# Patient Record
Sex: Male | Born: 1975 | Race: White | Hispanic: No | Marital: Married | Smoking: Current every day smoker
Health system: Southern US, Community
[De-identification: ages and names within clinical notes are randomized; demographics above are authoritative.]

## PROBLEM LIST (undated history)

## (undated) DIAGNOSIS — I10 Essential (primary) hypertension: Secondary | ICD-10-CM

## (undated) DIAGNOSIS — G4730 Sleep apnea, unspecified: Secondary | ICD-10-CM

## (undated) DIAGNOSIS — I639 Cerebral infarction, unspecified: Secondary | ICD-10-CM

## (undated) DIAGNOSIS — I219 Acute myocardial infarction, unspecified: Secondary | ICD-10-CM

## (undated) DIAGNOSIS — E785 Hyperlipidemia, unspecified: Secondary | ICD-10-CM

## (undated) DIAGNOSIS — F4310 Post-traumatic stress disorder, unspecified: Secondary | ICD-10-CM

## (undated) HISTORY — DX: Acute myocardial infarction, unspecified: I21.9

## (undated) HISTORY — DX: Cerebral infarction, unspecified: I63.9

## (undated) HISTORY — DX: Hyperlipidemia, unspecified: E78.5

---

## 2005-01-24 ENCOUNTER — Emergency Department: Payer: Self-pay | Admitting: Emergency Medicine

## 2005-03-15 ENCOUNTER — Emergency Department: Payer: Self-pay | Admitting: Emergency Medicine

## 2006-04-03 ENCOUNTER — Other Ambulatory Visit: Payer: Self-pay

## 2006-04-03 ENCOUNTER — Emergency Department: Payer: Self-pay | Admitting: Emergency Medicine

## 2006-08-26 ENCOUNTER — Emergency Department: Payer: Self-pay | Admitting: Emergency Medicine

## 2008-01-08 ENCOUNTER — Emergency Department: Payer: Self-pay | Admitting: Emergency Medicine

## 2012-02-23 ENCOUNTER — Emergency Department (HOSPITAL_COMMUNITY)
Admission: EM | Admit: 2012-02-23 | Discharge: 2012-02-24 | Disposition: A | Discharge status: Home / Self Care | Payer: Self-pay | Attending: *Deleted | Admitting: *Deleted

## 2012-02-23 ENCOUNTER — Encounter (HOSPITAL_COMMUNITY): Payer: Self-pay | Admitting: Emergency Medicine

## 2012-02-23 DIAGNOSIS — F17200 Nicotine dependence, unspecified, uncomplicated: Secondary | ICD-10-CM | POA: Insufficient documentation

## 2012-02-23 DIAGNOSIS — E119 Type 2 diabetes mellitus without complications: Secondary | ICD-10-CM | POA: Insufficient documentation

## 2012-02-23 DIAGNOSIS — R45851 Suicidal ideations: Secondary | ICD-10-CM | POA: Insufficient documentation

## 2012-02-23 LAB — CBC
HCT: 48.2 % (ref 39.0–52.0)
Hemoglobin: 16.7 g/dL (ref 13.0–17.0)
MCH: 31.1 pg (ref 26.0–34.0)
MCHC: 34.6 g/dL (ref 30.0–36.0)

## 2012-02-23 LAB — DIFFERENTIAL
Basophils Relative: 0 % (ref 0–1)
Eosinophils Absolute: 0 10*3/uL (ref 0.0–0.7)
Eosinophils Relative: 0 % (ref 0–5)
Monocytes Absolute: 0.6 10*3/uL (ref 0.1–1.0)
Monocytes Relative: 6 % (ref 3–12)

## 2012-02-23 LAB — RAPID URINE DRUG SCREEN, HOSP PERFORMED
Barbiturates: NOT DETECTED
Benzodiazepines: NOT DETECTED
Cocaine: NOT DETECTED

## 2012-02-23 LAB — BASIC METABOLIC PANEL
BUN: 14 mg/dL (ref 6–23)
Creatinine, Ser: 1.16 mg/dL (ref 0.50–1.35)
GFR calc Af Amer: >90 mL/min (ref >90)
GFR calc non Af Amer: 81 mL/min — ABNORMAL LOW (ref >90)

## 2012-02-23 MED ORDER — ONDANSETRON HCL 4 MG PO TABS: 4.0000 mg | ORAL_TABLET | Freq: Three times a day (TID) | ORAL | Status: DC | PRN

## 2012-02-23 MED ORDER — NICOTINE 21 MG/24HR TD PT24: 21.0000 mg | MEDICATED_PATCH | Freq: Every day | TRANSDERMAL | Status: DC | PRN

## 2012-02-23 MED ORDER — ZOLPIDEM TARTRATE 5 MG PO TABS: 5.0000 mg | ORAL_TABLET | Freq: Every evening | ORAL | Status: DC | PRN

## 2012-02-23 MED ORDER — ACETAMINOPHEN 325 MG PO TABS: 650.0000 mg | ORAL_TABLET | ORAL | Status: DC | PRN

## 2012-02-23 MED ORDER — LORAZEPAM 1 MG PO TABS: 1.0000 mg | ORAL_TABLET | Freq: Three times a day (TID) | ORAL | Status: DC | PRN

## 2012-02-23 MED ORDER — IBUPROFEN 400 MG PO TABS: 400.0000 mg | ORAL_TABLET | Freq: Three times a day (TID) | ORAL | Status: DC | PRN

## 2012-02-23 MED ORDER — ALUM & MAG HYDROXIDE-SIMETH 200-200-20 MG/5ML PO SUSP: 30.0000 mL | ORAL | Status: DC | PRN

## 2012-02-23 NOTE — ED Notes (Signed)
Patient states "I had a plan tonight to kill myself. I had a knife in my hand and was going to cut my wrist." Patient states he was recently separated from his wife.
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
## 2012-02-24 ENCOUNTER — Encounter (HOSPITAL_COMMUNITY): Payer: Self-pay | Admitting: *Deleted

## 2012-02-24 ENCOUNTER — Inpatient Hospital Stay (HOSPITAL_COMMUNITY)
Admission: AD | Admit: 2012-02-24 | Discharge: 2012-02-28 | DRG: 103 | Disposition: A | Discharge status: Home / Self Care | Payer: 59 | Attending: Psychiatry | Admitting: Psychiatry

## 2012-02-24 DIAGNOSIS — F17200 Nicotine dependence, unspecified, uncomplicated: Secondary | ICD-10-CM | POA: Diagnosis present

## 2012-02-24 DIAGNOSIS — R45851 Suicidal ideations: Secondary | ICD-10-CM

## 2012-02-24 DIAGNOSIS — R454 Irritability and anger: Secondary | ICD-10-CM | POA: Diagnosis present

## 2012-02-24 DIAGNOSIS — E119 Type 2 diabetes mellitus without complications: Secondary | ICD-10-CM | POA: Diagnosis present

## 2012-02-24 DIAGNOSIS — Z87828 Personal history of other (healed) physical injury and trauma: Secondary | ICD-10-CM

## 2012-02-24 DIAGNOSIS — S069X9A Unspecified intracranial injury with loss of consciousness of unspecified duration, initial encounter: Secondary | ICD-10-CM

## 2012-02-24 DIAGNOSIS — F0781 Postconcussional syndrome: Principal | ICD-10-CM

## 2012-02-24 MED ORDER — ALUM & MAG HYDROXIDE-SIMETH 200-200-20 MG/5ML PO SUSP: 30.0000 mL | ORAL | Status: DC | PRN

## 2012-02-24 MED ORDER — ACETAMINOPHEN 325 MG PO TABS: 650.0000 mg | ORAL_TABLET | Freq: Four times a day (QID) | ORAL | Status: DC | PRN

## 2012-02-24 MED ORDER — MAGNESIUM HYDROXIDE 400 MG/5ML PO SUSP: 30.0000 mL | Freq: Every day | ORAL | Status: DC | PRN

## 2012-02-24 MED ORDER — NICOTINE 21 MG/24HR TD PT24
21.0000 mg | MEDICATED_PATCH | Freq: Every day | TRANSDERMAL | Status: DC
  Administered 2012-02-25 – 2012-02-28 (×5): 21 mg via TRANSDERMAL
  Filled 2012-02-24 (×7): qty 1

## 2012-02-24 MED ORDER — TRAZODONE HCL 50 MG PO TABS
50.0000 mg | ORAL_TABLET | Freq: Every evening | ORAL | Status: DC | PRN
  Administered 2012-02-26 – 2012-02-27 (×2): 50 mg via ORAL
  Filled 2012-02-24 (×2): qty 1
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
## 2012-02-24 NOTE — ED Notes (Signed)
Patient is awake at current time. Offered him something to drink and asked did he need to go to the restroom, stated that he is comfortable is going back to sleep.
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
## 2012-02-24 NOTE — Progress Notes (Signed)
Patient ID: Don Owens, male   DOB: 03/05/1976, 36 y.o.   MRN: [id number] This is the first admission for this 36 year old caucasian male, admitted voluntarily to [facility], due to feeling suicidal about the loss of his marriage, as of yesterday. He resports he has been legally divorced for 1 month, that this is his 2nd marriage and that she has left him...and " turned everybody against me" and I don';t have anyting...." he denies PMH, other than DM, for which he states he should take glucophage..he states he cannot afford it, that he last took it 2 weeks ago and that he takes it " when I know my blood sugar is up" " i don't have a blood sugar machine....". He denies allergies to foods and / or drugs, stated he has  GED, that he works at [employer] and that he " feels much safer" " now that I'm here...". After admission is completed, verbal reprots of this pt's status is given to NA, RN, along with instruction that careplan, as well as MD orders need to be completed on this pt's admission PD RN [facility]

## 2012-02-24 NOTE — Progress Notes (Signed)
Patient resting quietly in room upon my approach. Patient withdrawn and forwards little. Patient denies any needs or concerns at this time.Patient denies SI/HI, denies A/V hallucinations. Patient offered support and encouragement. Patient remains safe on unit with Q15 minute checks for safety. Will continue to monitor.

## 2012-02-24 NOTE — ED Notes (Signed)
carelink here for pt.  

## 2012-02-24 NOTE — Progress Notes (Signed)
Brief Nutrition Note  Reason: Patient screened positive nutrition risk for unintentional weight loss >10 lb over 1 month,  Patient reported he is eating well at [facility]. PTA he had not eaten anything since Sunday. He reported he always wanted to loose weight but it took getting to this point to loose the weight. He reported he was not eating PTA due to lack of appetite.   Wt Readings from Last 10 Encounters:  02/23/12 275 lb (124.739 kg)   I encouraged the patient to have adequate PO intake. I briefly educated the patient on healthy nutrition and food choices. The patient did not have any nutrition questions.   RD available for nutrition needs.   Iven Finn [facility] [phone]

## 2012-02-24 NOTE — Progress Notes (Signed)
[facility] MD Progress Note  02/24/2012 5:34 PM  Diagnosis:   Axis I: Suicidal thoughts with head injury with subsequent difficulty with anger Axis II: Deferred Axis III:  Past Medical History  Diagnosis Date  . Diabetes mellitus     ADL's:  Intact  Sleep: Good  Appetite:  Poor  Suicidal Ideation:  Pt had thoughts to be dead yesterday and had plans of writing a goodbye letter to his family because he was going to kill himself. Homicidal Ideation:  Denies adamantly any homicidal thoughts.  Mental Status Examination/Evaluation: Objective:  Appearance: Casual  Eye Contact::  Fair  Speech:  Clear and Coherent  Volume:  Normal  Mood:  Anxious, Depressed, Hopeless, Irritable and Worthless  Affect:  Congruent  Thought Process:  Coherent  Orientation:  Full  Thought Content:  WDL  Suicidal Thoughts:  Yes.  without intent/plan  Homicidal Thoughts:  No  Memory:  Immediate;   Fair  Judgement:  Impaired  Insight:  Lacking  Psychomotor Activity:  Normal  Concentration:  Fair  Recall:  Fair  Akathisia:  No  Handed:  Right  AIMS (if indicated):     Assets:  Communication Skills Desire for Improvement  Sleep:      Vital Signs:There were no vitals taken for this visit. Current Medications: Current Facility-Administered Medications  Medication Dose Route Frequency Provider Last Rate Last Dose  . acetaminophen (TYLENOL) tablet 650 mg  650 mg Oral Q6H PRN Mike Craze, MD      . alum & mag hydroxide-simeth (MAALOX/MYLANTA) 200-200-20 MG/5ML suspension 30 mL  30 mL Oral Q4H PRN Mike Craze, MD      . magnesium hydroxide (MILK OF MAGNESIA) suspension 30 mL  30 mL Oral Daily PRN Mike Craze, MD      . traZODone (DESYREL) tablet 50 mg  50 mg Oral QHS PRN Mike Craze, MD       Facility-Administered Medications Ordered in Other Encounters  Medication Dose Route Frequency Provider Last Rate Last Dose  . DISCONTD: acetaminophen (TYLENOL) tablet 650 mg  650 mg Oral Q4H PRN Laray Anger, DO      . DISCONTD: alum & mag hydroxide-simeth (MAALOX/MYLANTA) 200-200-20 MG/5ML suspension 30 mL  30 mL Oral PRN Laray Anger, DO      . DISCONTD: ibuprofen (ADVIL,MOTRIN) tablet 400 mg  400 mg Oral Q8H PRN Laray Anger, DO      . DISCONTD: LORazepam (ATIVAN) tablet 1 mg  1 mg Oral Q8H PRN Laray Anger, DO      . DISCONTD: nicotine (NICODERM CQ - dosed in mg/24 hours) patch 21 mg  21 mg Transdermal Daily PRN Laray Anger, DO      . DISCONTD: ondansetron [facility]) tablet 4 mg  4 mg Oral Q8H PRN Laray Anger, DO      . DISCONTD: zolpidem (AMBIEN) tablet 5 mg  5 mg Oral QHS PRN Laray Anger, DO        Lab Results:  Results for orders placed during the hospital encounter of 02/23/12 (from the past 48 hour(s))  ETHANOL     Status: Normal   Collection Time   02/23/12  9:33 PM      Component Value Range Comment   Alcohol, Ethyl (B) <11  0 - 11 mg/dL   CBC     Status: Abnormal   Collection Time   02/23/12  9:33 PM      Component Value Range Comment  WBC 11.4 (*) 4.0 - 10.5 K/uL    RBC 5.37  4.22 - 5.81 MIL/uL    Hemoglobin 16.7  13.0 - 17.0 g/dL    HCT 95.6  21.3 - 08.6 %    MCV 89.8  78.0 - 100.0 fL    MCH 31.1  26.0 - 34.0 pg    MCHC 34.6  30.0 - 36.0 g/dL    RDW 57.8  46.9 - 62.9 %    Platelets 219  150 - 400 K/uL   DIFFERENTIAL     Status: Normal   Collection Time   02/23/12  9:33 PM      Component Value Range Comment   Neutrophils Relative 65  43 - 77 %    Neutro Abs 7.5  1.7 - 7.7 K/uL    Lymphocytes Relative 29  12 - 46 %    Lymphs Abs 3.3  0.7 - 4.0 K/uL    Monocytes Relative 6  3 - 12 %    Monocytes Absolute 0.6  0.1 - 1.0 K/uL    Eosinophils Relative 0  0 - 5 %    Eosinophils Absolute 0.0  0.0 - 0.7 K/uL    Basophils Relative 0  0 - 1 %    Basophils Absolute 0.0  0.0 - 0.1 K/uL   BASIC METABOLIC PANEL     Status: Abnormal   Collection Time   02/23/12  9:33 PM      Component Value Range Comment   Sodium 142  135 - 145 mEq/L     Potassium 3.6  3.5 - 5.1 mEq/L    Chloride 103  96 - 112 mEq/L    CO2 26  19 - 32 mEq/L    Glucose, Bld 120 (*) 70 - 99 mg/dL    BUN 14  6 - 23 mg/dL    Creatinine, Ser 5.28  0.50 - 1.35 mg/dL    Calcium 41.3  8.4 - 10.5 mg/dL    GFR calc non Af Amer 81 (*) >90 mL/min    GFR calc Af Amer >90  >90 mL/min   URINE RAPID DRUG SCREEN (HOSP PERFORMED)     Status: Normal   Collection Time   02/23/12  9:35 PM      Component Value Range Comment   Opiates NONE DETECTED  NONE DETECTED    Cocaine NONE DETECTED  NONE DETECTED    Benzodiazepines NONE DETECTED  NONE DETECTED    Amphetamines NONE DETECTED  NONE DETECTED    Tetrahydrocannabinol NONE DETECTED  NONE DETECTED    Barbiturates NONE DETECTED  NONE DETECTED   GLUCOSE, CAPILLARY     Status: Abnormal   Collection Time   02/24/12  9:57 AM      Component Value Range Comment   Glucose-Capillary 107 (*) 70 - 99 mg/dL    Comment 1 Documented in Chart      Comment 2 Notify RN       Physical Findings: AIMS:  , ,  ,  ,    CIWA:  CIWA-Ar Total: 0  COWS:  COWS Total Score: 2   Treatment Plan Summary: Daily contact with patient to assess and evaluate symptoms and progress in treatment Medication management No suicidal thoughts for 48 hours and mood/anxiety less than 3/10 where 1 is the best and 10 is the worst  Plan: Admit, Start Tegretol for anger dyscontrol.  Don Owens 02/24/2012, 5:34 PM

## 2012-02-24 NOTE — Progress Notes (Signed)
0130 Don Owens, ACT has seen and evaluated patient. Paperwork has been sent to [facility]. Bed will not be available tonight.

## 2012-02-24 NOTE — BH Assessment (Signed)
Assessment Note   Don Owens is an 36 y.o. male. Patient continues to endorse suicidality, depressed mood, loss of interest in usual activities and inability to contract for safety. He continues to need inpatient stabilization.   Patient has been accepted by Jorje Guild PA to Dr. Dan Humphreys, room 504.1  Axis I: Depressive Disorder NOS Axis II: Deferred Axis III:  Past Medical History  Diagnosis Date  . Diabetes mellitus    Axis IV: problems with primary support group Axis V: 30  Past Medical History:  Past Medical History  Diagnosis Date  . Diabetes mellitus     History reviewed. No pertinent past surgical history.  Family History: History reviewed. No pertinent family history.  Social History:  reports that he has been smoking.  He does not have any smokeless tobacco history on file. He reports that he does not drink alcohol or use illicit drugs.  Additional Social History:     CIWA: CIWA-Ar BP: 109/62 mmHg Pulse Rate: 61  COWS:    Allergies: No Known Allergies  Home Medications:  (Not in a hospital admission)  OB/GYN Status:  No LMP for male patient.  General Assessment Data Location of Assessment: [facility] ACT Assessment: Yes Living Arrangements: Alone Can pt return to current living arrangement?: Yes Admission Status: Voluntary Is patient capable of signing voluntary admission?: Yes Transfer from: Home Referral Source: Self/Family/Friend  Education Status Is patient currently in school?: No Contact person: JUDY SEAGREVES-[phone]  Risk to self Suicidal Ideation: Yes-Currently Present Suicidal Intent: Yes-Currently Present Is patient at risk for suicide?: Yes Suicidal Plan?: Yes-Currently Present Specify Current Suicidal Plan:  (Cut wrist) Access to Means: Yes Specify Access to Suicidal Means:  (Sharps) What has been your use of drugs/alcohol within the last 12 months?:  (None reported) Previous Attempts/Gestures: Yes How many times?:   (1x) Other Self Harm Risks:  (None reported) Triggers for Past Attempts: Spouse contact Intentional Self Injurious Behavior: Cutting Comment - Self Injurious Behavior:  (Cut self last week) Family Suicide History: No Recent stressful life event(s): Loss (Comment) (Separation from wife x1 month) Persecutory voices/beliefs?: No Depression: Yes Depression Symptoms: Despondent;Isolating;Loss of interest in usual pleasures;Feeling worthless/self pity;Feeling angry/irritable Substance abuse history and/or treatment for substance abuse?: No Suicide prevention information given to non-admitted patients: Not applicable  Risk to Others Homicidal Ideation: No Thoughts of Harm to Others: No Current Homicidal Intent: No Current Homicidal Plan: No Access to Homicidal Means: No Identified Victim:  (Na) History of harm to others?: No Assessment of Violence: None Noted Violent Behavior Description:  (Na) Does patient have access to weapons?: No Criminal Charges Pending?: No Does patient have a court date: No  Psychosis Hallucinations: None noted Delusions: None noted  Mental Status Report Appear/Hygiene:  (WNL) Eye Contact: Fair Motor Activity: Unremarkable;Freedom of movement Speech: Logical/coherent Level of Consciousness: Alert Mood: Depressed;Sad;Sullen;Worthless, low self-esteem Affect: Appropriate to circumstance;Depressed;Blunted;Sad Anxiety Level: Minimal Thought Processes: Coherent;Relevant Judgement: Impaired Orientation: Person;Place;Time;Situation Obsessive Compulsive Thoughts/Behaviors: Moderate  Cognitive Functioning Concentration: Decreased Memory: Recent Intact;Remote Intact IQ: Average Insight: Poor Impulse Control: Poor Appetite: Poor Weight Loss:  (None noted) Weight Gain:  (None noted) Sleep: Decreased Total Hours of Sleep:  (4 hours) Vegetative Symptoms: None  ADLScreening [facility] Assessment Services) Patient's cognitive ability adequate to safely complete  daily activities?: Yes Patient able to express need for assistance with ADLs?: Yes Independently performs ADLs?: Yes  Abuse/Neglect [facility]) Physical Abuse: Denies Verbal Abuse: Denies Sexual Abuse: Denies  Prior Inpatient Therapy Prior Inpatient Therapy: No  Prior Outpatient  Therapy Prior Outpatient Therapy: No  ADL Screening (condition at time of admission) Patient's cognitive ability adequate to safely complete daily activities?: Yes Patient able to express need for assistance with ADLs?: Yes Independently performs ADLs?: Yes       Abuse/Neglect Assessment (Assessment to be complete while patient is alone) Physical Abuse: Denies Verbal Abuse: Denies Sexual Abuse: Denies Values / Beliefs Cultural Requests During Hospitalization: None Spiritual Requests During Hospitalization: None        Additional Information 1:1 In Past 12 Months?: No CIRT Risk: No Elopement Risk: No Does patient have medical clearance?: Yes     Disposition:  Disposition Disposition of Patient: Inpatient treatment program (Accepted to [facility]) Type of inpatient treatment program: Adult  On Site Evaluation by:   Reviewed with Physician:     Rudi Coco 02/24/2012 9:40 AM

## 2012-02-25 LAB — HEMOGLOBIN A1C
Hgb A1c MFr Bld: 6.6 % — ABNORMAL HIGH (ref <5.7)
Mean Plasma Glucose: 143 mg/dL — ABNORMAL HIGH (ref <117)

## 2012-02-25 LAB — GLUCOSE, CAPILLARY: Glucose-Capillary: 91 mg/dL (ref 70–99)

## 2012-02-25 MED ORDER — INSULIN ASPART 100 UNIT/ML ~~LOC~~ SOLN
0.0000 [IU] | Freq: Three times a day (TID) | SUBCUTANEOUS | Status: DC
  Administered 2012-02-27: 3 [IU] via SUBCUTANEOUS

## 2012-02-25 MED ORDER — CARBAMAZEPINE 200 MG PO TABS
200.0000 mg | ORAL_TABLET | Freq: Two times a day (BID) | ORAL | Status: AC
  Administered 2012-02-25: 200 mg via ORAL
  Filled 2012-02-25 (×2): qty 1

## 2012-02-25 MED ORDER — INSULIN ASPART 100 UNIT/ML ~~LOC~~ SOLN
6.0000 [IU] | Freq: Three times a day (TID) | SUBCUTANEOUS | Status: DC
  Administered 2012-02-25 – 2012-02-28 (×7): 6 [IU] via SUBCUTANEOUS

## 2012-02-25 MED ORDER — FAMOTIDINE 20 MG PO TABS
10.0000 mg | ORAL_TABLET | Freq: Two times a day (BID) | ORAL | Status: DC
  Administered 2012-02-25 – 2012-02-28 (×7): 10 mg via ORAL
  Filled 2012-02-25 (×2): qty 0.5
  Filled 2012-02-25: qty 1
  Filled 2012-02-25 (×8): qty 0.5

## 2012-02-25 MED ORDER — CARBAMAZEPINE ER 200 MG PO TB12
200.0000 mg | ORAL_TABLET | Freq: Two times a day (BID) | ORAL | Status: DC
  Administered 2012-02-25: 200 mg via ORAL
  Filled 2012-02-25 (×6): qty 1

## 2012-02-25 MED ORDER — INSULIN ASPART 100 UNIT/ML ~~LOC~~ SOLN: 0.0000 [IU] | Freq: Every day | SUBCUTANEOUS | Status: DC

## 2012-02-25 MED ORDER — CARBAMAZEPINE 200 MG PO TABS
200.0000 mg | ORAL_TABLET | Freq: Three times a day (TID) | ORAL | Status: DC
  Administered 2012-02-26 – 2012-02-28 (×8): 200 mg via ORAL
  Filled 2012-02-25 (×11): qty 1

## 2012-02-25 MED ORDER — METFORMIN HCL 500 MG PO TABS
500.0000 mg | ORAL_TABLET | Freq: Two times a day (BID) | ORAL | Status: DC
  Administered 2012-02-25 – 2012-02-28 (×6): 500 mg via ORAL
  Filled 2012-02-25 (×9): qty 1

## 2012-02-25 NOTE — Progress Notes (Signed)
Patient seen during d/c planning group and discharge planning group.  He advised of SI and depression due to recent separation from wife of two years.  He currently denies SI/HI and stated being here made him realizes his problems are not as bad as he thought.  He rates depression at two three and anxiety at two. Patient advised he sometimes drinks large amounts of ETOH. l He reports drinking seven beers, four shots, and two glasses of moonshine on Sunday.  Patient not interested in any residential treatment.  He is followed by [facility] in [place].  He reports having home and transportation.  He will need assistance with indigent medications.

## 2012-02-25 NOTE — Progress Notes (Signed)
D: Pt appears pensive and slightly sad; pt denies SI/HI; pt does want to know if he will be started "on any more medications" A: Pt given emotional support and was told that his questions will be followed up on by RN and MD R: Pt remains appropriate and cooperative; will continue to monitor for safety

## 2012-02-25 NOTE — Progress Notes (Signed)
Pt stated that he wasn't feeling very hungry and that he didn't want to receive his insulin shot; AC insulin was held by RN until pt returns from lunch
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
## 2012-02-25 NOTE — Progress Notes (Signed)
[facility] Group Notes:  (Counselor/Nursing/MHT/Case Management/Adjunct)  02/25/2012 1:15 PM  Type of Therapy:  Group Therapy, Dance/Movement Therapy   Participation Level:  Active  Participation Quality:  Attentive, Sharing and Supportive  Affect:  Appropriate  Cognitive:  Appropriate  Insight:  Limited  Engagement in Group:  Limited  Engagement in Therapy:  Limited  Modes of Intervention:  Clarification, Problem-solving, Role-play, Socialization and Support  Summary of Progress/Problems:Pt participated in a group discussion about how to find hope upon D/C. Pt spoke about wanting a better realionship with his son and how this can inspire him to make positive changes at D/C. 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    [name]
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
## 2012-02-25 NOTE — H&P (Signed)
Medical/psychiatric screening examination/treatment/procedure(s) were performed by non-physician practitioner and as supervising physician I was immediately available for consultation/collaboration.  I have seen and examined this patient and agree the major elements of this evaluation.  

## 2012-02-26 LAB — GLUCOSE, CAPILLARY: Glucose-Capillary: 170 mg/dL — ABNORMAL HIGH (ref 70–99)

## 2012-02-26 NOTE — Progress Notes (Signed)
[facility] Group Notes:  (Counselor/Nursing/MHT/Case Management/Adjunct)  02/26/2012 4:46 PM  Type of Therapy:  Group Therapy  Participation Level:  Did Not Attend    Neila Gear 02/26/2012, 4:46 PM

## 2012-02-26 NOTE — Progress Notes (Signed)
D) Pt attended the morning group and was able to express his feelings in the group. States that he went and talked with his father prior to coming here asking for help indirectly. Pt was able to call his father today and started talking with him. Working on his homework. Denies SI and HI A) Given support and reassurance. Provided with a 1:1. Encouraged to speak with his father and share his needs with him.  R) Denies SI and HI.
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
## 2012-02-26 NOTE — Progress Notes (Signed)
[facility] Group Notes:  (Counselor/Nursing/MHT/Case Management/Adjunct)  02/26/2012 9:29 AM  Type of Therapy:  After Care Planning  Group  Pt. participated in after care planning group and was given the [place] suicide prevention information and crisis hot line numbers to call in case of emergency. Pt. agreed to [place] the numbers if needed.  The pt. stated that he was doing good  But had problems sleeping last night . Pt. denied SI/Hi and stated he came to [facility] on Thursday .   Don Owens [place] 02/26/2012, 9:29 AM

## 2012-02-26 NOTE — Progress Notes (Signed)
Psychoeducational Group Note  Date:  02/25/12  Time:  2305  Group Topic/Focus:  Wrap-Up Group:   The focus of this group is to help patients review their daily goal of treatment and discuss progress on daily workbooks.  Participation Level:  Active  Participation Quality:  Appropriate and Sharing  Affect:  Appropriate  Cognitive:  Appropriate  Insight:  Good  Engagement in Group:  Good  Additional Comments:  Pt attended wrap up group this evening.  Pt expressed here for suicidal attempt.  He expressed boss has been calling him asking when will come back to work.  He said that the doctor would discharge him Monday.  Referred pt to social worker for a hospital note if needed for work.  Aundria Rud, Natalina Wieting L 02/26/2012, 1:31 AM

## 2012-02-26 NOTE — Progress Notes (Signed)
Patient ID: Don Owens, male   DOB: [birth year], 36 y.o.   MRN: [id number] The patient had a blunted affect and maintained only brief eye contact when talking. He was more interested in watching the [facility] game than attending group. Feels he is ready for discharge. Denied any suicidal/homicidal ideation. Blood sugar at 109 did not meet parameters to receive HS insulin coverage. Interacted with select patients in the milieu.

## 2012-02-27 LAB — GLUCOSE, CAPILLARY
Glucose-Capillary: 118 mg/dL — ABNORMAL HIGH (ref 70–99)
Glucose-Capillary: 116 mg/dL — ABNORMAL HIGH (ref 70–99)
Glucose-Capillary: 121 mg/dL — ABNORMAL HIGH (ref 70–99)

## 2012-02-27 NOTE — Progress Notes (Signed)
Patient ID: Don Owens, male   DOB: 10/21/1975, 36 y.o.   MRN: [id number] The patient has a depressed mood and flat affect. He denies any suicidal ideation. Attended evening group. Stated that his mother-in-law visited and brought him some clean clothes. He lives withhis mother-in-law and stated that she was the only person he had in his life who cares about him. He has not seen his son from his first marriage since Christmas. Called and lefty a message asking his ex-wife if she would bring his son for a visit. He did not get a return call this evening to his request and was feeling down. The patient has not been compliant with with diet or medication related to his diabetes. Needs further diabetic teaching. Stated that he Owens't afford his medication or a glucometer.
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
## 2012-02-27 NOTE — Progress Notes (Signed)
[facility] Group Notes:  (Counselor/Nursing/MHT/Case Management/Adjunct)  02/27/2012 4:08 PM  Type of Therapy:  Group Therapy  Participation Level:  Did Not Attend    Don Owens 02/27/2012, 4:08 PM

## 2012-02-28 LAB — GLUCOSE, CAPILLARY
Glucose-Capillary: 92 mg/dL (ref 70–99)
Glucose-Capillary: 109 mg/dL — ABNORMAL HIGH (ref 70–99)

## 2012-02-28 LAB — CARBAMAZEPINE LEVEL, TOTAL: Carbamazepine Lvl: 8.2 ug/mL (ref 4.0–12.0)

## 2012-02-28 MED ORDER — CARBAMAZEPINE 200 MG PO TABS: 400.0000 mg | ORAL_TABLET | Freq: Two times a day (BID) | ORAL | Status: DC

## 2012-02-28 MED ORDER — FAMOTIDINE 10 MG PO CHEW: 10.0000 mg | CHEWABLE_TABLET | Freq: Two times a day (BID) | ORAL | Status: DC

## 2012-02-28 MED ORDER — METFORMIN HCL 500 MG PO TABS: 500.0000 mg | ORAL_TABLET | Freq: Two times a day (BID) | ORAL | Status: DC

## 2012-02-28 MED ORDER — TRAZODONE HCL 50 MG PO TABS: 50.0000 mg | ORAL_TABLET | Freq: Every evening | ORAL | Status: DC | PRN

## 2012-02-28 MED ORDER — NICOTINE 21 MG/24HR TD PT24: 1.0000 | MEDICATED_PATCH | Freq: Every day | TRANSDERMAL | Status: AC

## 2012-02-28 MED ORDER — CARBAMAZEPINE 200 MG PO TABS
400.0000 mg | ORAL_TABLET | Freq: Two times a day (BID) | ORAL | Status: DC
  Filled 2012-02-28: qty 2

## 2012-02-28 NOTE — Progress Notes (Signed)
Patient ID: Don Owens, male   DOB: [birth year], 36 y.o.   MRN: [id number] The patient continues to have a depressed mood and sad, flat affect. Denies suicidal/homicidal ideation. He is concerned about what medication he will be discharged on and how he will afford it. He likes the way the Tegretol helps him to stop obsessing about his wife whom he is separated from. Needs further Diabetic teaching. Consult ordered for Diabetic Nurse Specialist to visit.
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
## 2012-02-28 NOTE — Progress Notes (Addendum)
Nursing Discharge Note: patient discharged home per MD order.  Discharge instructions were reviewed and patient understood all instructions.  He is following up with [facility].  He was calm and cooperative upon discharge.  He denies any SI/HI/AVH.  He left ambulatory with ride.  Patient's medications were called into his pharmacy.

## 2012-02-28 NOTE — Progress Notes (Signed)
[facility] Group Notes:  (Counselor/Nursing/MHT/Case Management/Adjunct) 02/28/2012  11:00am Overcoming Obstacles to Wellness   Type of Therapy:  Group Therapy  Participation Level:  Did Not Attend     Don Owens 02/28/2012   3:23 PM         [facility] Group Notes:  (Counselor/Nursing/MHT/Case Management/Adjunct) 02/28/2012  1:15PM  Rules for Recovery   Type of Therapy:  Group Therapy  Participation Level:  Did Not Attend     Don Owens 02/28/2012   3:24 PM

## 2012-02-28 NOTE — Progress Notes (Signed)
[facility] Case Management Discharge Plan:  Will you be returning to the same living situation after discharge: Yes,  Patient to return to his home At discharge, do you have transportation home?:Yes,  Patient to arrange his own transportation home Do you have the ability to pay for your medications:No.Patient will be assisted with indigent medications  Interagency Information:     Release of information consent forms completed and in the chart;  Patient's signature needed at discharge.  Patient to Follow up at:  Follow-up Information    Follow up with [facility]. (You are scheduled with [facility] hospital discharge clinic on Tuesday 6/18,/13 between 8:30 AM - 11:00AM)    Contact information:   [address] [place], [place]  [address]  [phone]         Patient denies SI/HI:   Yes,  Patient is no longer endorsing SI    Safety Planning and Suicide Prevention discussed:  Yes,  Reviewed during discharge planning group  Barrier to discharge identified:Yes,  Recent separation from wife  Summary and Recommendations: Patient encourage to be compliant with medications and follow up with outpatient recommedations    Keilynn Marano, [place]es[place] July 02/28/2012, 10:57 AM

## 2012-02-28 NOTE — Tx Team (Signed)
Interdisciplinary Treatment Plan Update (Adult)  Date:  02/28/2012  Time Reviewed:  10:33 AM   Progress in Treatment: Attending groups:   Yes   Participating in groups:  Yes Taking medication as prescribed:  Yes Tolerating medication:  Yes Family/Significant othe contact made:  Patient understands diagnosis:  Yes Discussing patient identified problems/goals with staff: Yes Medical problems stabilized or resolved: Yes Denies suicidal/homicidal ideation:Yes Issues/concerns per patient self-inventory:  Other:  New problem(s) identified:  Reason for Continuation of Hospitalization:  Interventions implemented related to continuation of hospitalization:  Additional comments:  Estimated length of stay:  Discharge Plan:  New goal(s):  Review of initial/current patient goals per problem list:    1.  Goal(s): Eliminate SI/other thoughts of self harm   Met:  Yes  Target date: d/c  As evidenced by: Patient no longer endorses SI/other thought self harm    2.  Goal (s): Reduce depression/anxiety (rated at zero today)  Met:  Yes  Target date: d/c  As evidenced by: Patient currently rating symptoms at four or below  3.  Goal(s): .stabilize on meds   Met:  Yes  Target date: d/c  As evidenced by: Patient reports being stable on medications - symptoms have decreased    4.  Goal(s): Refer for outpatient follow up   Met:  Yes  Target date: d/c  As evidenced by: Follow up appointment scheduled    Attendees: Patient:  Don Owens 02/28/2012 10:41 AM  Nursing:  Alease Frame, RN 02/28/2012 10:54 AM  Physician:  Orson Aloe, MD 02/28/2012 10:33 AM   Nursing:   Rodman Key, RN 02/28/2012 10:33 AM   CaseManager:  Juline Patch, LCSW 02/28/2012 10:33 AM   Counselor:  Angus Palms, LCSW 02/28/2012 10:33 AM   Other:  Reyes Ivan, LCSWA

## 2012-02-28 NOTE — Progress Notes (Signed)
Inpatient Diabetes Program Recommendations  AACE/ADA: New Consensus Statement on Inpatient Glycemic Control (2009)  Target Ranges:  Prepandial:   less than 140 mg/dL      Peak postprandial:   less than 180 mg/dL (1-2 hours)      Critically ill patients:  140 - 180 mg/dL   Reason for Visit: Consult - new onset Pt is not new-onset.  Noted pt's d/c to home.  Noted meds were called in to pharmacy, discharge instructions given and he understood. No further action.    Note:

## 2012-02-28 NOTE — Progress Notes (Signed)
[facility] MD Progress Note  02/28/2012 2:06 PM  Diagnosis:   Axis I: Chronit Traumatic Encephalopathy Axis II: Deferred Axis III:  Past Medical History  Diagnosis Date  . Diabetes mellitus     ADL's:  Intact  Sleep: Good,   Appetite:  Good,   Suicidal Ideation:  Pt denies any suicidal thoughts today. Homicidal Ideation:  Denies adamantly any homicidal thoughts.  Mental Status Examination/Evaluation: Objective:  Appearance: Casual  Eye Contact::  Good  Speech:  Clear and Coherent  Volume:  Normal  Mood:  Euthymic  Affect:  Congruent  Thought Process:  Coherent  Orientation:  Full  Thought Content:  WDL  Suicidal Thoughts:  No  Homicidal Thoughts:  No  Memory:  Immediate;   Good  Judgement:  Good  Insight:  Good  Psychomotor Activity:  Normal  Concentration:  Good  Recall:  Good  Akathisia:  No  Handed:  Right  AIMS (if indicated):     Assets:  Communication Skills Desire for Improvement  Sleep:  Number of Hours: 6.5    ROS: Neuro: no headaches, ataxia, weakness  GI: no N/V/D/cramps/constipation  MS: no weakness, muscle cramps, aches.  Vital Signs:Blood pressure 105/71, pulse 101, temperature 97.8 F (36.6 C), temperature source Oral, resp. rate 16. Current Medications: Current Facility-Administered Medications  Medication Dose Route Frequency Provider Last Rate Last Dose  . acetaminophen (TYLENOL) tablet 650 mg  650 mg Oral Q6H PRN Mike Craze, MD      . alum & mag hydroxide-simeth (MAALOX/MYLANTA) 200-200-20 MG/5ML suspension 30 mL  30 mL Oral Q4H PRN Mike Craze, MD      . carbamazepine (TEGRETOL) tablet 400 mg  400 mg Oral BID Mike Craze, MD      . famotidine (PEPCID) tablet 10 mg  10 mg Oral BID Sanjuana Kava, NP   10 mg at 02/28/12 0813  . insulin aspart (novoLOG) injection 0-20 Units  0-20 Units Subcutaneous TID WC Sanjuana Kava, NP   3 Units at 02/27/12 1719  . insulin aspart (novoLOG) injection 0-5 Units  0-5 Units Subcutaneous QHS Sanjuana Kava, NP      . insulin aspart (novoLOG) injection 6 Units  6 Units Subcutaneous TID WC Mike Craze, MD   6 Units at 02/28/12 1206  . magnesium hydroxide (MILK OF MAGNESIA) suspension 30 mL  30 mL Oral Daily PRN Mike Craze, MD      . metFORMIN (GLUCOPHAGE) tablet 500 mg  500 mg Oral BID WC Sanjuana Kava, NP   500 mg at 02/28/12 0814  . nicotine (NICODERM CQ - dosed in mg/24 hours) patch 21 mg  21 mg Transdermal Daily Mike Craze, MD   21 mg at 02/28/12 0815  . traZODone (DESYREL) tablet 50 mg  50 mg Oral QHS PRN Mike Craze, MD   50 mg at 02/27/12 2224  . DISCONTD: carbamazepine (TEGRETOL) tablet 200 mg  200 mg Oral TID Mike Craze, MD   200 mg at 02/28/12 1401    Lab Results:  Results for orders placed during the hospital encounter of 02/24/12 (from the past 48 hour(s))  GLUCOSE, CAPILLARY     Status: Abnormal   Collection Time   02/26/12  5:06 PM      Component Value Range Comment   Glucose-Capillary 109 (*) 70 - 99 mg/dL    Comment 1 Documented in Chart      Comment 2 Notify RN     GLUCOSE,  CAPILLARY     Status: Abnormal   Collection Time   02/26/12  9:30 PM      Component Value Range Comment   Glucose-Capillary 170 (*) 70 - 99 mg/dL    Comment 1 Notify RN     GLUCOSE, CAPILLARY     Status: Abnormal   Collection Time   02/27/12  6:00 AM      Component Value Range Comment   Glucose-Capillary 118 (*) 70 - 99 mg/dL   GLUCOSE, CAPILLARY     Status: Abnormal   Collection Time   02/27/12 11:54 AM      Component Value Range Comment   Glucose-Capillary 116 (*) 70 - 99 mg/dL   GLUCOSE, CAPILLARY     Status: Abnormal   Collection Time   02/27/12  5:04 PM      Component Value Range Comment   Glucose-Capillary 121 (*) 70 - 99 mg/dL   GLUCOSE, CAPILLARY     Status: Normal   Collection Time   02/27/12  8:58 PM      Component Value Range Comment   Glucose-Capillary 92  70 - 99 mg/dL    Comment 1 Notify RN     GLUCOSE, CAPILLARY     Status: Abnormal   Collection Time    02/28/12  6:10 AM      Component Value Range Comment   Glucose-Capillary 113 (*) 70 - 99 mg/dL   CARBAMAZEPINE LEVEL, TOTAL     Status: Normal   Collection Time   02/28/12  6:20 AM      Component Value Range Comment   Carbamazepine Lvl 8.2  4.0 - 12.0 ug/mL   GLUCOSE, CAPILLARY     Status: Abnormal   Collection Time   02/28/12 11:50 AM      Component Value Range Comment   Glucose-Capillary 109 (*) 70 - 99 mg/dL     Physical Findings: AIMS:  , ,  ,  ,    CIWA:  CIWA-Ar Total: 0  COWS:  COWS Total Score: 2   Treatment Plan Summary: Daily contact with patient to assess and evaluate symptoms and progress in treatment Medication management No suicidal thoughts for 48 hours and mood/anxiety less than 3/10 where 1 is the best and 10 is the worst  Plan: 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  He feels that he definitely needs to stay on the Tegretol.  Will strongly support this.  Advised him that Tegretol accellerates the metabolism of itself and other medications.   His level is well in range.  In 1 month the level will be 40% lower.  Will go ahead and increase the dose now. His CBG's and Hemoglobin A1c are strongly pointing to him needing Glucophage BID.  He will need to follow-up CLOSELY with his primary care provider to prevent further BLOOD VESSEL damage (i.e. stroke, heart attack, kidney failure) associated with uncontrolled blood sugar. Will D/C today.  He has met inpatient goals.  Temperence Zenor 02/28/2012, 2:06 PM
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
## 2012-02-28 NOTE — Progress Notes (Signed)
Pt. Has good eye contact and appears calm this am. Rates his depression and hopelessness a 1 out of 10 today. And denies any thoughts of Si or HI and contracts for safety aPt. Wants to stay busy working and try to ,"erase his ex wife from his life." He is waiting to receive diabetic instruction and case worker aware of pt.s need to get affordable meds.

## 2012-02-29 NOTE — Progress Notes (Signed)
Patient Discharge Instructions:  After Visit Summary (AVS):   Faxed to:  02/29/2012 Psychiatric Admission Assessment Note:   Faxed to:  02/29/2012 Suicide Risk Assessment - Discharge Assessment:   Faxed to:  02/29/2012 Faxed/Sent to the Next Level Care provider:  02/29/2012  Information faxed to: [facility] @ [phone]  Karleen Hampshire Brittini, 02/29/2012, 5:48 PM

## 2012-03-07 NOTE — Discharge Summary (Signed)
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 Diabetes mellitus    AXIS IV:  other psychosocial or environmental problems AXIS V:  51-60 moderate symptoms  Level of Care:  OP  Hospital Course:   This is a 36 year old Caucasian male, admitted to [facility] from the [facility] ED with complaints of suicidal ideations and plans to cut his wrists. Patient reports, "I was thinking about cutting my wrist to die that way. I am going through a separation from my wife. We have been married x 21/2 years, and separated x 1 month today. She has already found herself a boyfriend, and they are already vacationing together. I miss her a lot and I'm not used to sleeping in the bed without her beside me. All I wanted to do was to talk to her and see if counseling will be a way to go. But she told me that she does not have the love the that she used to have for me any more. May be I needed to move on with my life as it is, but I cannot do that staying here. I need to be discharged because I'm not suicidal any more. I need to get back to work too or I'm going to lose my job. My boss told me to get back to work or his going to find some else"  While a patient in this hospital, Don Owens received medication management for his anger dysregulation.  He further gave a history of a head injury and racing thoughts.  They were ordered and received Tegretol for the anger, racing thoughts, and anxiety.  They had a  remarkable improvement on the Tegretol.  They also received Nicoderm patches for the dependence and noted good control of cravings.  They were also enrolled in group counseling sessions and activities in which they participated actively.   Patient attended treatment team meeting this am and met with treatment team members. Pt symptoms, treatment plan and response to treatment discussed. Don Owens endorsed that their symptoms have improved. Pt also stated that they are stable for discharge. In other to maintain anger control, racing thoughts, and anxiety, they will continue psychiatric care on outpatient basis. They will follow-up at [facility] on 6/18 as a walk-in.  Upon discharge, patient adamantly denies suicidal, homicidal ideations, auditory, visual hallucinations and or delusional thinking. They left [facility] with all personal belongings via personal transportation in no apparent distress.  Consults:  None  Significant Diagnostic Studies:  labs: Tegretol level 8.7  Discharge Vitals:   Blood pressure 105/71, pulse 101, temperature 97.8 F (36.6 C), temperature source Oral, resp. rate 16.  Mental Status Exam: See Mental Status Examination and Suicide Risk Assessment completed by Attending Physician prior to discharge.  Discharge destination:  Home  Is patient on multiple antipsychotic therapies at discharge:  No   Has Patient had three or more failed trials of antipsychotic monotherapy by history: N/A Recommended Plan for Multiple Antipsychotic Therapies:  N/A  Medication List  As of 03/07/2012 11:36 PM   TAKE these medications      Indication    carbamazepine 200 MG tablet   Commonly known as: TEGRETOL   Take 2 tablets (400 mg total) by mouth 2 (two) times daily. For control of anger and racing thoughts.       famotidine 10 MG chewable tablet   Commonly known as: PEPCID AC   Chew 1 tablet (10 mg total) by mouth 2 (two) times daily. For control of stomach acid secretion and helps GERD.         metFORMIN 500 MG tablet   Commonly known as: GLUCOPHAGE   Take 1 tablet (500 mg total) by mouth 2 (two) times daily with a meal. Take TWICE EVERYDAY to prevent stroke/heartattacks    Indication: Type 2 Diabetes      nicotine 21 mg/24hr patch   Commonly known as: NICODERM CQ - dosed in mg/24 hours   Place 1 patch onto the skin daily. For smoking cessation to prevent stroke/heartattack       traZODone 50 MG tablet   Commonly known as: DESYREL   Take 1 tablet (50 mg total) by mouth at bedtime as needed (Insomnia).            Follow-up Information    Follow up with [facility]. (You are scheduled with [facility] hospital discharge clinic on Tuesday 6/18,/13 between 8:30 AM - 11:00AM)    Contact information:   [address] [place], [place]  [address]  [phone]        Follow-up recommendations:   Activities: Resume typical activities Diet: Resume typical diet Other: Follow up with outpatient provider and report any side effects to out patient prescriber.  SignedDan Humphreys, [place] 03/07/2012, 11:36 PM

## 2016-02-16 ENCOUNTER — Encounter (HOSPITAL_COMMUNITY): Payer: Self-pay | Admitting: *Deleted

## 2016-02-16 ENCOUNTER — Emergency Department (HOSPITAL_COMMUNITY)
Admission: EM | Admit: 2016-02-16 | Discharge: 2016-02-17 | Disposition: A | Discharge status: Home / Self Care | Payer: Self-pay | Attending: Emergency Medicine | Admitting: Emergency Medicine

## 2016-02-16 ENCOUNTER — Emergency Department (HOSPITAL_COMMUNITY): Payer: Self-pay

## 2016-02-16 DIAGNOSIS — M5431 Sciatica, right side: Secondary | ICD-10-CM

## 2016-02-16 DIAGNOSIS — E119 Type 2 diabetes mellitus without complications: Secondary | ICD-10-CM | POA: Insufficient documentation

## 2016-02-16 DIAGNOSIS — F17210 Nicotine dependence, cigarettes, uncomplicated: Secondary | ICD-10-CM | POA: Insufficient documentation

## 2016-02-16 DIAGNOSIS — Z7984 Long term (current) use of oral hypoglycemic drugs: Secondary | ICD-10-CM | POA: Insufficient documentation

## 2016-02-16 DIAGNOSIS — M5441 Lumbago with sciatica, right side: Secondary | ICD-10-CM | POA: Insufficient documentation

## 2016-02-16 HISTORY — DX: Post-traumatic stress disorder, unspecified: F43.10

## 2016-02-16 MED ORDER — HYDROCODONE-ACETAMINOPHEN 5-325 MG PO TABS
2.0000 | ORAL_TABLET | Freq: Once | ORAL | Status: AC
  Administered 2016-02-16: 2 via ORAL
  Filled 2016-02-16: qty 2
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
## 2016-02-17 MED ORDER — METHOCARBAMOL 500 MG PO TABS: 500.0000 mg | ORAL_TABLET | Freq: Four times a day (QID) | ORAL | Status: DC

## 2016-02-17 MED ORDER — TRAMADOL HCL 50 MG PO TABS: 50.0000 mg | ORAL_TABLET | Freq: Four times a day (QID) | ORAL | Status: DC | PRN

## 2016-02-17 NOTE — Discharge Instructions (Signed)
Back Exercises If you have pain in your back, do these exercises 2-3 times each day or as told by your doctor. When the pain goes away, do the exercises once each day, but repeat the steps more times for each exercise (do more repetitions). If you do not have pain in your back, do these exercises once each day or as told by your doctor. EXERCISES Single Knee to Chest Do these steps 3-5 times in a row for each leg:  Lie on your back on a firm bed or the floor with your legs stretched out.  Bring one knee to your chest.  Hold your knee to your chest by grabbing your knee or thigh.  Pull on your knee until you feel a gentle stretch in your lower back.  Keep doing the stretch for 10-30 seconds.  Slowly let go of your leg and straighten it. Pelvic Tilt Do these steps 5-10 times in a row:  Lie on your back on a firm bed or the floor with your legs stretched out.  Bend your knees so they point up to the ceiling. Your feet should be flat on the floor.  Tighten your lower belly (abdomen) muscles to press your lower back against the floor. This will make your tailbone point up to the ceiling instead of pointing down to your feet or the floor.  Stay in this position for 5-10 seconds while you gently tighten your muscles and breathe evenly. Cat-Cow Do these steps until your lower back bends more easily:  Get on your hands and knees on a firm surface. Keep your hands under your shoulders, and keep your knees under your hips. You may put padding under your knees.  Let your head hang down, and make your tailbone point down to the floor so your lower back is round like the back of a cat.  Stay in this position for 5 seconds.  Slowly lift your head and make your tailbone point up to the ceiling so your back hangs low (sags) like the back of a cow.  Stay in this position for 5 seconds. Press-Ups Do these steps 5-10 times in a row:  Lie on your belly (face-down) on the floor.  Place your  hands near your head, about shoulder-width apart.  While you keep your back relaxed and keep your hips on the floor, slowly straighten your arms to raise the top half of your body and lift your shoulders. Do not use your back muscles. To make yourself more comfortable, you may change where you place your hands.  Stay in this position for 5 seconds.  Slowly return to lying flat on the floor. Bridges Do these steps 10 times in a row:  Lie on your back on a firm surface.  Bend your knees so they point up to the ceiling. Your feet should be flat on the floor.  Tighten your butt muscles and lift your butt off of the floor until your waist is almost as high as your knees. If you do not feel the muscles working in your butt and the back of your thighs, slide your feet 1-2 inches farther away from your butt.  Stay in this position for 3-5 seconds.  Slowly lower your butt to the floor, and let your butt muscles relax. If this exercise is too easy, try doing it with your arms crossed over your chest. Belly Crunches Do these steps 5-10 times in a row:  Lie on your back on a firm bed  or the floor with your legs stretched out.  Bend your knees so they point up to the ceiling. Your feet should be flat on the floor.  Cross your arms over your chest.  Tip your chin a little bit toward your chest but do not bend your neck.  Tighten your belly muscles and slowly raise your chest just enough to lift your shoulder blades a tiny bit off of the floor.  Slowly lower your chest and your head to the floor. Back Lifts Do these steps 5-10 times in a row:  Lie on your belly (face-down) with your arms at your sides, and rest your forehead on the floor.  Tighten the muscles in your legs and your butt.  Slowly lift your chest off of the floor while you keep your hips on the floor. Keep the back of your head in line with the curve in your back. Look at the floor while you do this.  Stay in this position  for 3-5 seconds.  Slowly lower your chest and your face to the floor. GET HELP IF:  Your back pain gets a lot worse when you do an exercise.  Your back pain does not lessen 2 hours after you exercise. If you have any of these problems, stop doing the exercises. Do not do them again unless your doctor says it is okay. GET HELP RIGHT AWAY IF:  You have sudden, very bad back pain. If this happens, stop doing the exercises. Do not do them again unless your doctor says it is okay.   This information is not intended to replace advice given to you by your health care provider. Make sure you discuss any questions you have with your health care provider.   Document Released: 10/02/2010 Document Revised: 05/21/2015 Document Reviewed: 10/24/2014 Elsevier Interactive Patient Education 2016 [employer] Sciatica Sciatica is pain, weakness, numbness, or tingling along the path of the sciatic nerve. The nerve starts in the lower back and runs down the back of each leg. The nerve controls the muscles in the lower leg and in the back of the knee, while also providing sensation to the back of the thigh, lower leg, and the sole of your foot. Sciatica is a symptom of another medical condition. For instance, nerve damage or certain conditions, such as a herniated disk or bone spur on the spine, pinch or put pressure on the sciatic nerve. This causes the pain, weakness, or other sensations normally associated with sciatica. Generally, sciatica only affects one side of the body. CAUSES   Herniated or slipped disc.  Degenerative disk disease.  A pain disorder involving the narrow muscle in the buttocks (piriformis syndrome).  Pelvic injury or fracture.  Pregnancy.  Tumor (rare). SYMPTOMS  Symptoms can vary from mild to very severe. The symptoms usually travel from the low back to the buttocks and down the back of the leg. Symptoms can include:  Mild tingling or dull aches in the lower back, leg, or  hip.  Numbness in the back of the calf or sole of the foot.  Burning sensations in the lower back, leg, or hip.  Sharp pains in the lower back, leg, or hip.  Leg weakness.  Severe back pain inhibiting movement. These symptoms may get worse with coughing, sneezing, laughing, or prolonged sitting or standing. Also, being overweight may worsen symptoms. DIAGNOSIS  Your caregiver will perform a physical exam to look for common symptoms of sciatica. He or she may ask you to do certain  movements or activities that would trigger sciatic nerve pain. Other tests may be performed to find the cause of the sciatica. These may include:  Blood tests.  X-rays.  Imaging tests, such as an MRI or CT scan. TREATMENT  Treatment is directed at the cause of the sciatic pain. Sometimes, treatment is not necessary and the pain and discomfort goes away on its own. If treatment is needed, your caregiver may suggest:  Over-the-counter medicines to relieve pain.  Prescription medicines, such as anti-inflammatory medicine, muscle relaxants, or narcotics.  Applying heat or ice to the painful area.  Steroid injections to lessen pain, irritation, and inflammation around the nerve.  Reducing activity during periods of pain.  Exercising and stretching to strengthen your abdomen and improve flexibility of your spine. Your caregiver may suggest losing weight if the extra weight makes the back pain worse.  Physical therapy.  Surgery to eliminate what is pressing or pinching the nerve, such as a bone spur or part of a herniated disk. HOME CARE INSTRUCTIONS   Only take over-the-counter or prescription medicines for pain or discomfort as directed by your caregiver.  Apply ice to the affected area for 20 minutes, 3-4 times a day for the first 48-72 hours. Then try heat in the same way.  Exercise, stretch, or perform your usual activities if these do not aggravate your pain.  Attend physical therapy sessions as  directed by your caregiver.  Keep all follow-up appointments as directed by your caregiver.  Do not wear high heels or shoes that do not provide proper support.  Check your mattress to see if it is too soft. A firm mattress may lessen your pain and discomfort. SEEK IMMEDIATE MEDICAL CARE IF:   You lose control of your bowel or bladder (incontinence).  You have increasing weakness in the lower back, pelvis, buttocks, or legs.  You have redness or swelling of your back.  You have a burning sensation when you urinate.  You have pain that gets worse when you lie down or awakens you at night.  Your pain is worse than you have experienced in the past.  Your pain is lasting longer than 4 weeks.  You are suddenly losing weight without reason. MAKE SURE YOU:  Understand these instructions.  Will watch your condition.  Will get help right away if you are not doing well or get worse.   This information is not intended to replace advice given to you by your health care provider. Make sure you discuss any questions you have with your health care provider.   Document Released: 08/24/2001 Document Revised: 05/21/2015 Document Reviewed: 01/09/2012 Elsevier Interactive Patient Education [employer]2016 [employer] Inc[employer]

## 2016-02-17 NOTE — ED Provider Notes (Signed)
CSN: [id number]     Arrival date & time 02/16/16  2228 History   First MD Initiated Contact with Patient 02/16/16 2319     Chief Complaint  Patient presents with  . Back Pain     (Consider location/radiation/quality/duration/timing/severity/associated sxs/prior Treatment) Patient is a 40 y.o. male presenting with back pain. The history is provided by the patient. No language interpreter was used.  Back Pain Location:  Generalized Quality:  Aching Radiates to:  Does not radiate Pain severity:  Severe Pain is:  Same all the time Onset quality:  Gradual Duration:  2 days Timing:  Constant Progression:  Worsening Chronicity:  New Context: not recent illness   Relieved by:  Nothing Worsened by:  Nothing tried Ineffective treatments:  Ibuprofen Associated symptoms: no abdominal pain   Risk factors: obesity     Past Medical History  Diagnosis Date  . Diabetes mellitus   . PTSD (post-traumatic stress disorder)    History reviewed. No pertinent past surgical history. Family History  Problem Relation Age of Onset  . Family history unknown: Yes   Social History  Substance Use Topics  . Smoking status: Current Every Day Smoker -- 0.75 packs/day    Types: Cigarettes  . Smokeless tobacco: None  . Alcohol Use: 9.2 oz/week    7 Cans of beer, 5 Shots of liquor, 4 Standard drinks or equivalent per week    Review of Systems  Gastrointestinal: Negative for abdominal pain.  Musculoskeletal: Positive for back pain.  All other systems reviewed and are negative.     Allergies  Review of patient's allergies indicates no known allergies.  Home Medications   Prior to Admission medications   Medication Sig Start Date End Date Taking? Authorizing Provider  carbamazepine (TEGRETOL) 200 MG tablet Take 2 tablets (400 mg total) by mouth 2 (two) times daily. For control of anger and racing thoughts. 02/28/12 02/27/13  Mike CrazeEdwin O Walker, MD  famotidine (PEPCID AC) 10 MG chewable tablet Chew 1  tablet (10 mg total) by mouth 2 (two) times daily. For control of stomach acid secretion and helps GERD. 02/28/12   Mike CrazeEdwin O Walker, MD  metFORMIN (GLUCOPHAGE) 500 MG tablet Take 1 tablet (500 mg total) by mouth 2 (two) times daily with a meal. Take TWICE EVERYDAY to prevent stroke/heartattacks 02/28/12   Mike CrazeEdwin O Walker, MD  traZODone (DESYREL) 50 MG tablet Take 1 tablet (50 mg total) by mouth at bedtime as needed (Insomnia). 02/28/12 03/29/12  Mike CrazeEdwin O Walker, MD   BP 136/91 mmHg  Pulse 82  Temp(Src) 98.7 F (37.1 C) (Oral)  Resp 18  Ht 5\' 11"  (1.803 m)  Wt 127.007 kg  BMI 39.07 kg/m2  SpO2 98% Physical Exam  Constitutional: He is oriented to person, place, and time. He appears well-developed and well-nourished.  HENT:  Head: Normocephalic and atraumatic.  Eyes: Conjunctivae and EOM are normal. Pupils are equal, round, and reactive to light.  Neck: Normal range of motion.  Cardiovascular: Normal rate.   Pulmonary/Chest: Effort normal.  Abdominal: Soft. He exhibits no distension.  Musculoskeletal: Normal range of motion. He exhibits tenderness.  Diffusely tender ls spine and sacral area  Neurological: He is alert and oriented to person, place, and time.  Skin: Skin is warm.  Psychiatric: He has a normal mood and affect.  Nursing note and vitals reviewed.   ED Course  Procedures (including critical care time) Labs Review Labs Reviewed - No data to display  Imaging Review Dg Lumbar Spine Complete  02/17/2016  CLINICAL DATA:  40 year old male with severe lower back pain radiating to the extremities. EXAM: LUMBAR SPINE - COMPLETE 4+ VIEW COMPARISON:  None. FINDINGS: There is no acute fracture or subluxation of the lumbar spine. The vertebral body heights and disc spaces are maintained. There is minimal loss of vertebral body height at T11, age indeterminate. Clinical correlation is recommended. The visualized transverse and spinous processes appear intact. The soft tissues are grossly  unremarkable. IMPRESSION: No acute/traumatic lumbar spine pathology. Electronically Signed   By: Elgie Collard M.D.   On: 02/17/2016 00:29   I have personally reviewed and evaluated these images and lab results as part of my medical decision-making.   EKG Interpretation None      MDM Pt advised against taking excess ibuprofen. I will treat with Robaxin and tramadol.   Pt is advised to see his MD for recheck   Final diagnoses:  Back pain with right-sided sciatica    Meds ordered this encounter  Medications  . HYDROcodone-acetaminophen (NORCO/VICODIN) 5-325 MG per tablet 2 tablet    Sig:   . traMADol (ULTRAM) 50 MG tablet    Sig: Take 1 tablet (50 mg total) by mouth every 6 (six) hours as needed.    Dispense:  15 tablet    Refill:  0    Order Specific Question:  Supervising Provider    Answer:  MILLER, BRIAN [[id number]]  . methocarbamol (ROBAXIN) 500 MG tablet    Sig: Take 1 tablet (500 mg total) by mouth 4 (four) times daily.    Dispense:  20 tablet    Refill:  0    Order Specific Question:  Supervising Provider    Answer:  Eber Hong [[id number]]     Medication List    TAKE these medications        methocarbamol 500 MG tablet  Commonly known as:  ROBAXIN  Take 1 tablet (500 mg total) by mouth 4 (four) times daily.     traMADol 50 MG tablet  Commonly known as:  ULTRAM  Take 1 tablet (50 mg total) by mouth every 6 (six) hours as needed.      ASK your doctor about these medications        carbamazepine 200 MG tablet  Commonly known as:  TEGRETOL  Take 2 tablets (400 mg total) by mouth 2 (two) times daily. For control of anger and racing thoughts.     famotidine 10 MG chewable tablet  Commonly known as:  PEPCID AC  Chew 1 tablet (10 mg total) by mouth 2 (two) times daily. For control of stomach acid secretion and helps GERD.     metFORMIN 500 MG tablet  Commonly known as:  GLUCOPHAGE  Take 1 tablet (500 mg total) by mouth 2 (two) times daily with a meal. Take TWICE  EVERYDAY to prevent stroke/heartattacks     traZODone 50 MG tablet  Commonly known as:  DESYREL  Take 1 tablet (50 mg total) by mouth at bedtime as needed (Insomnia).      An After Visit Summary was printed and given to the patient.    Lonia Skinner [place], PA-C 02/17/16 1610  Bethann Berkshire, MD 02/19/16 1230

## 2019-06-01 ENCOUNTER — Encounter (HOSPITAL_COMMUNITY): Payer: Self-pay

## 2019-06-01 ENCOUNTER — Other Ambulatory Visit: Payer: Self-pay

## 2019-06-01 DIAGNOSIS — Z7984 Long term (current) use of oral hypoglycemic drugs: Secondary | ICD-10-CM | POA: Insufficient documentation

## 2019-06-01 DIAGNOSIS — G459 Transient cerebral ischemic attack, unspecified: Secondary | ICD-10-CM | POA: Insufficient documentation

## 2019-06-01 DIAGNOSIS — Z79899 Other long term (current) drug therapy: Secondary | ICD-10-CM | POA: Insufficient documentation

## 2019-06-01 DIAGNOSIS — F17210 Nicotine dependence, cigarettes, uncomplicated: Secondary | ICD-10-CM | POA: Insufficient documentation

## 2019-06-01 DIAGNOSIS — E119 Type 2 diabetes mellitus without complications: Secondary | ICD-10-CM | POA: Insufficient documentation

## 2019-06-01 NOTE — ED Triage Notes (Signed)
Pt states he awoke this am at 0800 with some tingling in his left arm.  It went away after a few hours and returned tonight approx 1930 tonight.   Pt states now he has "pins and needles" feeling in his left arm and down left leg.  Pt also states his blood sugars have been running high.

## 2019-06-02 ENCOUNTER — Emergency Department (HOSPITAL_COMMUNITY)
Admission: EM | Admit: 2019-06-02 | Discharge: 2019-06-02 | Discharge status: Against Medical Advice | Payer: Self-pay | Attending: Emergency Medicine | Admitting: Emergency Medicine

## 2019-06-02 ENCOUNTER — Emergency Department (HOSPITAL_COMMUNITY): Payer: Self-pay

## 2019-06-02 ENCOUNTER — Other Ambulatory Visit: Payer: Self-pay

## 2019-06-02 DIAGNOSIS — G459 Transient cerebral ischemic attack, unspecified: Secondary | ICD-10-CM

## 2019-06-02 LAB — APTT: aPTT: 30 seconds (ref 24–36)

## 2019-06-02 LAB — COMPREHENSIVE METABOLIC PANEL
ALT: 28 U/L (ref 0–44)
AST: 15 U/L (ref 15–41)
Albumin: 3.7 g/dL (ref 3.5–5.0)
Alkaline Phosphatase: 91 U/L (ref 38–126)
Anion gap: 8 (ref 5–15)
BUN: 17 mg/dL (ref 6–20)
CO2: 28 mmol/L (ref 22–32)
Calcium: 9 mg/dL (ref 8.9–10.3)
Chloride: 105 mmol/L (ref 98–111)
Creatinine, Ser: 1.05 mg/dL (ref 0.61–1.24)
GFR calc Af Amer: >60 mL/min (ref >60)
GFR calc non Af Amer: >60 mL/min (ref >60)
Glucose, Bld: 326 mg/dL — ABNORMAL HIGH (ref 70–99)
Potassium: 3.7 mmol/L (ref 3.5–5.1)
Sodium: 141 mmol/L (ref 135–145)
Total Bilirubin: 0.6 mg/dL (ref 0.3–1.2)
Total Protein: 6.6 g/dL (ref 6.5–8.1)

## 2019-06-02 LAB — DIFFERENTIAL
Abs Immature Granulocytes: 0.08 10*3/uL — ABNORMAL HIGH (ref 0.00–0.07)
Basophils Absolute: 0.1 10*3/uL (ref 0.0–0.1)
Basophils Relative: 1 %
Eosinophils Absolute: 0.1 10*3/uL (ref 0.0–0.5)
Eosinophils Relative: 1 %
Immature Granulocytes: 1 %
Lymphocytes Relative: 33 %
Lymphs Abs: 3.4 10*3/uL (ref 0.7–4.0)
Monocytes Absolute: 0.7 10*3/uL (ref 0.1–1.0)
Monocytes Relative: 6 %
Neutro Abs: 6 10*3/uL (ref 1.7–7.7)
Neutrophils Relative %: 58 %

## 2019-06-02 LAB — CBC
HCT: 43.4 % (ref 39.0–52.0)
Hemoglobin: 14.3 g/dL (ref 13.0–17.0)
MCH: 30.3 pg (ref 26.0–34.0)
MCHC: 32.9 g/dL (ref 30.0–36.0)
MCV: 91.9 fL (ref 80.0–100.0)
Platelets: 179 10*3/uL (ref 150–400)
RBC: 4.72 MIL/uL (ref 4.22–5.81)
RDW: 13 % (ref 11.5–15.5)
WBC: 10.3 10*3/uL (ref 4.0–10.5)
nRBC: 0 % (ref 0.0–0.2)

## 2019-06-02 LAB — PROTIME-INR
INR: 0.8 (ref 0.8–1.2)
Prothrombin Time: 11.5 seconds (ref 11.4–15.2)

## 2019-06-02 LAB — GLUCOSE, CAPILLARY: Glucose-Capillary: 322 mg/dL — ABNORMAL HIGH (ref 70–99)

## 2019-06-02 NOTE — ED Provider Notes (Signed)
[facility] EMERGENCY DEPARTMENT Provider Note   CSN: [id number] Arrival date & time: 06/01/19  2335   Time seen 4:25 AM  History   Chief Complaint Chief Complaint  Patient presents with  . numbness left side    HPI Ky BarbanBryan K Mckeithan is a 43 y.o. male.     HPI patient is sleeping and hard to keep awake.  He states when he woke up this morning on September 18 around 8 AM he had numbness of his left upper extremity from the shoulder to the fingertips.  He states it lasted until about lunchtime.  Patient states he is right-handed.  He states it came back around 2:30 PM and then about 7:30 PM he started having numbness of his left leg.  He states he felt off balance for the past couple days.  He denies feeling dizzy or lightheaded.  He denies headache or neck pain.  He states he went over to his girlfriend's house and she was afraid he was going to fall and stood beside him.  She states if he stood up for a short period of time and he would regain his balance and then he can walk okay.  Patient denies any weakness in his upper extremity or his lower extremity.  He states he is never had this happen before.  Patient states he has had diabetes since 2016 however he has not taken any medication for several years.  He states his last A1C was 13.  He states his CBGs have been high and his roommate has "insulin everywhere".  So he started taking her insulin this week.  He states he takes shots 4 times a day.  He takes 12 units of his blood sugars over 300 and 8 units if it is less than 300.  He is waiting to get into the free clinic now.  PCP none  Past Medical History:  Diagnosis Date  . Diabetes mellitus   . PTSD (post-traumatic stress disorder)     Patient Active Problem List   Diagnosis Date Noted  . Excessive anger 02/24/2012  . Head injury, closed, with LOC of unknown duration (HCC) 02/24/2012  . H/O head injury 02/24/2012    History reviewed. No pertinent surgical history.       Home Medications    Prior to Admission medications   Medication Sig Start Date End Date Taking? Authorizing Provider  carbamazepine (TEGRETOL) 200 MG tablet Take 2 tablets (400 mg total) by mouth 2 (two) times daily. For control of anger and racing thoughts. 02/28/12 02/27/13  Mike CrazeWalker, Edwin O, MD  famotidine (PEPCID AC) 10 MG chewable tablet Chew 1 tablet (10 mg total) by mouth 2 (two) times daily. For control of stomach acid secretion and helps GERD. 02/28/12   Mike CrazeWalker, Edwin O, MD  metFORMIN (GLUCOPHAGE) 500 MG tablet Take 1 tablet (500 mg total) by mouth 2 (two) times daily with a meal. Take TWICE EVERYDAY to prevent stroke/heartattacks 02/28/12   Mike CrazeWalker, Edwin O, MD  methocarbamol (ROBAXIN) 500 MG tablet Take 1 tablet (500 mg total) by mouth 4 (four) times daily. 02/17/16   Elson AreasSofia, Leslie K, PA-C  traMADol (ULTRAM) 50 MG tablet Take 1 tablet (50 mg total) by mouth every 6 (six) hours as needed. 02/17/16   Elson AreasSofia, Leslie K, PA-C  traZODone (DESYREL) 50 MG tablet Take 1 tablet (50 mg total) by mouth at bedtime as needed (Insomnia). 02/28/12 03/29/12  Mike CrazeWalker, Edwin O, MD    Family History Family History  Family history  unknown: Yes    Social History Social History   Tobacco Use  . Smoking status: Current Every Day Smoker    Packs/day: 0.75    Types: Cigarettes  . Smokeless tobacco: Never Used  Substance Use Topics  . Alcohol use: Yes    Alcohol/week: 16.0 standard drinks    Types: 7 Cans of beer, 5 Shots of liquor, 4 Standard drinks or equivalent per week  . Drug use: No     Allergies   Patient has no known allergies.   Review of Systems Review of Systems  All other systems reviewed and are negative.    Physical Exam Updated Vital Signs BP 113/77   Pulse 77   Temp 98.2 F (36.8 C) (Oral)   Resp 14   Ht 5\' 11"  (1.803 m)   Wt 110.7 kg   SpO2 100%   BMI 34.03 kg/m   Physical Exam Vitals signs and nursing note reviewed.  Constitutional:      General: He is not in acute  distress.    Appearance: Normal appearance. He is well-developed. He is not ill-appearing or toxic-appearing.  HENT:     Head: Normocephalic and atraumatic.     Right Ear: External ear normal.     Left Ear: External ear normal.     Nose: Nose normal. No mucosal edema or rhinorrhea.     Mouth/Throat:     Mouth: Mucous membranes are moist.     Dentition: No dental abscesses.     Pharynx: Oropharynx is clear. No oropharyngeal exudate, posterior oropharyngeal erythema or uvula swelling.  Eyes:     Extraocular Movements: Extraocular movements intact.     Conjunctiva/sclera: Conjunctivae normal.     Pupils: Pupils are equal, round, and reactive to light.  Neck:     Musculoskeletal: Full passive range of motion without pain, normal range of motion and neck supple.  Cardiovascular:     Rate and Rhythm: Normal rate and regular rhythm.     Heart sounds: Normal heart sounds. No murmur. No friction rub. No gallop.   Pulmonary:     Effort: Pulmonary effort is normal. No respiratory distress.     Breath sounds: Normal breath sounds. No wheezing, rhonchi or rales.  Chest:     Chest wall: No tenderness or crepitus.  Abdominal:     General: Bowel sounds are normal. There is no distension.     Palpations: Abdomen is soft.     Tenderness: There is no abdominal tenderness. There is no guarding or rebound.  Musculoskeletal: Normal range of motion.        General: No tenderness.     Comments: Moves all extremities well.   Skin:    General: Skin is warm and dry.     Coloration: Skin is not pale.     Findings: No erythema or rash.  Neurological:     General: No focal deficit present.     Mental Status: He is alert and oriented to person, place, and time.     Cranial Nerves: No cranial nerve deficit.     Comments: Grips are equal, there is no pronator drift, there is no obvious motor weakness. There is no motor weakness. When I check light touch of upper and lower extremities, he states there is no  difference between left and right.   Psychiatric:        Mood and Affect: Mood normal. Mood is not anxious.        Speech: Speech normal.  Behavior: Behavior normal.        Thought Content: Thought content normal.      ED Treatments / Results  Labs (all labs ordered are listed, but only abnormal results are displayed) Results for orders placed or performed during the hospital encounter of 06/02/19  Protime-INR  Result Value Ref Range   Prothrombin Time 11.5 11.4 - 15.2 seconds   INR 0.8 0.8 - 1.2  APTT  Result Value Ref Range   aPTT 30 24 - 36 seconds  CBC  Result Value Ref Range   WBC 10.3 4.0 - 10.5 K/uL   RBC 4.72 4.22 - 5.81 MIL/uL   Hemoglobin 14.3 13.0 - 17.0 g/dL   HCT 16.143.4 09.639.0 - 04.552.0 %   MCV 91.9 80.0 - 100.0 fL   MCH 30.3 26.0 - 34.0 pg   MCHC 32.9 30.0 - 36.0 g/dL   RDW 40.913.0 81.111.5 - 91.415.5 %   Platelets 179 150 - 400 K/uL   nRBC 0.0 0.0 - 0.2 %  Differential  Result Value Ref Range   Neutrophils Relative % 58 %   Neutro Abs 6.0 1.7 - 7.7 K/uL   Lymphocytes Relative 33 %   Lymphs Abs 3.4 0.7 - 4.0 K/uL   Monocytes Relative 6 %   Monocytes Absolute 0.7 0.1 - 1.0 K/uL   Eosinophils Relative 1 %   Eosinophils Absolute 0.1 0.0 - 0.5 K/uL   Basophils Relative 1 %   Basophils Absolute 0.1 0.0 - 0.1 K/uL   Immature Granulocytes 1 %   Abs Immature Granulocytes 0.08 (H) 0.00 - 0.07 K/uL  Comprehensive metabolic panel  Result Value Ref Range   Sodium 141 135 - 145 mmol/L   Potassium 3.7 3.5 - 5.1 mmol/L   Chloride 105 98 - 111 mmol/L   CO2 28 22 - 32 mmol/L   Glucose, Bld 326 (H) 70 - 99 mg/dL   BUN 17 6 - 20 mg/dL   Creatinine, Ser 7.821.05 0.61 - 1.24 mg/dL   Calcium 9.0 8.9 - 95.610.3 mg/dL   Total Protein 6.6 6.5 - 8.1 g/dL   Albumin 3.7 3.5 - 5.0 g/dL   AST 15 15 - 41 U/L   ALT 28 0 - 44 U/L   Alkaline Phosphatase 91 38 - 126 U/L   Total Bilirubin 0.6 0.3 - 1.2 mg/dL   GFR calc non Af Amer >60 >60 mL/min   GFR calc Af Amer >60 >60 mL/min   Anion gap 8  5 - 15  Glucose, capillary  Result Value Ref Range   Glucose-Capillary 322 (H) 70 - 99 mg/dL   Laboratory interpretation all normal except hyperglycemia    EKG EKG Interpretation  Date/Time:  Saturday June 02 2019 03:02:12 EDT Ventricular Rate:  80 PR Interval:    QRS Duration: 113 QT Interval:  404 QTC Calculation: 466 R Axis:   118 Text Interpretation:  Sinus rhythm Prolonged PR interval Incomplete right bundle branch block No significant change since last tracing 03 Apr 2006 Confirmed by Devoria AlbeKnapp, Saed Hudlow ([id number]) on 06/02/2019 4:22:37 AM   Radiology Ct Head Wo Contrast  Result Date: 06/02/2019 CLINICAL DATA:  Numbness and paresthesia EXAM: CT HEAD WITHOUT CONTRAST TECHNIQUE: Contiguous axial images were obtained from the base of the skull through the vertex without intravenous contrast. COMPARISON:  None. FINDINGS: Brain: No evidence of acute infarction, hemorrhage, hydrocephalus, extra-axial collection or mass lesion/mass effect. Vascular: No hyperdense vessel or unexpected calcification. Skull: Normal. Negative for fracture or focal lesion. Sinuses/Orbits: No acute  finding. Other: None IMPRESSION: Negative non contrasted CT appearance of the brain Electronically Signed   By: Jasmine Pang M.D.   On: 06/02/2019 01:14    Procedures Procedures (including critical care time)  Medications Ordered in ED Medications - No data to display   Initial Impression / Assessment and Plan / ED Course  I have reviewed the triage vital signs and the nursing notes.  Pertinent labs & imaging results that were available during my care of the patient were reviewed by me and considered in my medical decision making (see chart for details).       Teleneurology consult was ordered after seeing this patient.   Dr Hurshel Keys, Neurology, feels pt had a TIA, recommends inpatient admission and evaluation.   Pt does not want to be admitted and left AMA. He was informed he had a warning of a possible  major stroke, but he states he would take his chances.    Final Clinical Impressions(s) / ED Diagnoses   Final diagnoses:  TIA (transient ischemic attack)    Pt signed out AMA  Devoria Albe, MD, Concha Pyo, MD 06/02/19 0600

## 2019-06-02 NOTE — ED Notes (Signed)
Gave ekg to Dr. Tomi Bamberger

## 2019-06-02 NOTE — ED Notes (Signed)
Pt girlfriend came to nurses station stating pt would like to see nurse. This nurse went to see pt. Pt states " take the monitor off of me, the dr wants me to be admitted and im not doing that. Im just going home and going to bed." MD notified.   This nurse explained risks to pt for leaving ama and if pt has any more tingling in arm/leg/face or any other concerns to come back to ED. Pt states "ill take that chance". Pt verbalized understanding. Pt ambulatory out of ED.

## 2019-06-02 NOTE — Consult Note (Signed)
[facility] Tele[facility]rology Consult Services  Stat Consult  Date of Service:   06/02/2019 04:56:16  Impression:     .  Transient Ischemic Attack  Comments/Sign-Out: his symptoms are suspicious for a TIA. He would benefit from starting on aspirin and admission for workup for causes of TIA/stroke  CT HEAD: Showed No Acute Hemorrhage or Acute Core Infarct  Metrics: [facility] Notification Time: 06/02/2019 04:56:16 Stamp Time: 06/02/2019 04:56:16 Video Start Time: 06/02/2019 04:57:30 Video End Time: 06/02/2019 05:41:27  Our recommendations are outlined below.  Recommendations:     .  Initiate Aspirin 81 MG Daily     .  telemetry  Imaging Studies:     .  MRI Head     .  MRA Head and Neck Without Contrast When Available - Stroke Protocol     .  Echocardiogram - Transthoracic Echocardiogram  Disposition: Neurology Follow Up Recommended  Sign Out:     .  Discussed with Emergency Department Provider  ----------------------------------------------------------------------------------------------------  Chief Complaint: numbness left side body  History of Present Illness: Patient is a 43 year old Male.  43 yo who woke up 06/01/2019 and it lasted for about 4 hours. Then it started again in the afternoon and lasted for several hours again. It has now resolved.   Past Medical History:     . Hypertension     . Diabetes Mellitus     . Coronary Artery Disease  Anticoagulant use:  No  Antiplatelet use: No Examination: BP(141/90), Blood Glucose(322) 1A: Level of Consciousness - Alert; keenly responsive + 0 1B: Ask Month and Age - Both Questions Right + 0 1C: Blink Eyes & Squeeze Hands - Performs Both Tasks + 0 2: Test Horizontal Extraocular Movements - Normal + 0 3: Test Visual Fields - No Visual Loss + 0 4: Test Facial Palsy (Use Grimace if Obtunded) - Normal symmetry + 0 5A: Test Left Arm Motor Drift - No Drift for 10 Seconds + 0 5B: Test Right Arm Motor Drift -  No Drift for 10 Seconds + 0 6A: Test Left Leg Motor Drift - No Drift for 5 Seconds + 0 6B: Test Right Leg Motor Drift - No Drift for 5 Seconds + 0 7: Test Limb Ataxia (FNF/Heel-Shin) - No Ataxia + 0 8: Test Sensation - Normal; No sensory loss + 0 9: Test Language/Aphasia - Normal; No aphasia + 0 10: Test Dysarthria - Normal + 0 11: Test Extinction/Inattention - No abnormality + 0  NIHSS Score: 0   Due to the immediate potential for life-threatening deterioration due to underlying acute neurologic illness, I spent 35 minutes providing critical care. This time includes time for face to face visit via telemedicine, review of medical records, imaging studies and discussion of findings with providers, the patient and/or family.   Dr Janean Sark   [facility] [phone]   Case [id number]

## 2019-06-13 ENCOUNTER — Other Ambulatory Visit: Payer: Self-pay

## 2019-06-13 ENCOUNTER — Encounter: Payer: Self-pay | Admitting: Physician Assistant

## 2019-06-13 ENCOUNTER — Ambulatory Visit: Payer: Self-pay | Admitting: Physician Assistant

## 2019-06-13 VITALS — BP 132/92 | HR 104 | Temp 98.8°F | Ht 71.0 in

## 2019-06-13 DIAGNOSIS — F17200 Nicotine dependence, unspecified, uncomplicated: Secondary | ICD-10-CM

## 2019-06-13 DIAGNOSIS — R000 Tachycardia, unspecified: Secondary | ICD-10-CM

## 2019-06-13 DIAGNOSIS — E1165 Type 2 diabetes mellitus with hyperglycemia: Secondary | ICD-10-CM

## 2019-06-13 DIAGNOSIS — Z7689 Persons encountering health services in other specified circumstances: Secondary | ICD-10-CM

## 2019-06-13 DIAGNOSIS — F39 Unspecified mood [affective] disorder: Secondary | ICD-10-CM

## 2019-06-13 MED ORDER — SITAGLIPTIN PHOSPHATE 100 MG PO TABS: 100.0000 mg | ORAL_TABLET | Freq: Every day | ORAL | 0 refills | Status: DC

## 2019-06-13 MED ORDER — ASPIRIN EC 81 MG PO TBEC: 81.0000 mg | DELAYED_RELEASE_TABLET | Freq: Every day | ORAL | Status: AC

## 2019-06-13 NOTE — Progress Notes (Signed)
BP (!) 132/92   Pulse (!) 104   Temp 98.8 F (37.1 C)   Ht 5\' 11"  (1.803 m)   SpO2 97%   BMI 34.03 kg/m    Subjective:    Patient ID: Don Owens, male    DOB: 10/05/1975, 43 y.o.   MRN: [id number]  HPI: Don Owens is a 43 y.o. male presenting on 06/13/2019 for No chief complaint on file.   HPI    Pt had negative covid 19 screening questionnaire.     Pt states ore than 10 year ago since regular medical are  Pt seen in ER and felt to have a TIA and admission was recommended but pt left AMA.    Last mental health care 2013  Pt does not work.  He last worked in July.    He was working as a Administrator, [employer]  Pt dx DM 2016.  He was on metformin for it but stopped it in 2017 because he thought it was back for you.    Pt given himself insulin shots sometimes 1 or 2 or 3 times or sometimes not at all- unknow type of insulin and unknown quantity.      He has a meter at home.  fbs 256 today  110/70 pulse 109.       Relevant past medical, surgical, family and social history reviewed and updated as indicated. Interim medical history since our last visit reviewed. Allergies and medications reviewed and updated.   Current Outpatient Medications:  .  famotidine (PEPCID AC) 10 MG chewable tablet, Chew 1 tablet (10 mg total) by mouth 2 (two) times daily. For control of stomach acid secretion and helps GERD., Disp: 60 tablet, Rfl: 0 .  Multiple Vitamin (MULTIVITAMIN) capsule, Take 1 capsule by mouth daily., Disp: , Rfl:  .  UNKNOWN TO PATIENT, , Disp: , Rfl:      Review of Systems  Per HPI unless specifically indicated above     Objective:    BP (!) 132/92   Pulse (!) 104   Temp 98.8 F (37.1 C)   Ht 5\' 11"  (1.803 m)   SpO2 97%   BMI 34.03 kg/m   Wt Readings from Last 3 Encounters:  06/01/19 244 lb (110.7 kg)  02/16/16 280 lb (127 kg)  02/23/12 275 lb (124.7 kg)    Physical Exam Vitals signs reviewed.  Constitutional:      General: He is not in acute  distress.    Appearance: Normal appearance. He is well-developed. He is not ill-appearing.  HENT:     Head: Normocephalic and atraumatic.  Eyes:     Conjunctiva/sclera: Conjunctivae normal.     Pupils: Pupils are equal, round, and reactive to light.  Neck:     Musculoskeletal: Neck supple.     Thyroid: No thyromegaly.  Cardiovascular:     Rate and Rhythm: Regular rhythm. Tachycardia present.  Pulmonary:     Effort: Pulmonary effort is normal.     Breath sounds: Normal breath sounds. No wheezing or rales.  Abdominal:     General: Bowel sounds are normal.     Palpations: Abdomen is soft. There is no mass.     Tenderness: There is no abdominal tenderness.  Musculoskeletal:     Right lower leg: No edema.     Left lower leg: No edema.  Lymphadenopathy:     Cervical: No cervical adenopathy.  Skin:    General: Skin is warm and dry.  Findings: No rash.     Comments: All nails bitten down below quick.  No secondary infection seen.   Neurological:     Mental Status: He is alert and oriented to person, place, and time.     Cranial Nerves: No facial asymmetry.     Sensory: Sensation is intact.     Motor: No weakness or tremor.     Coordination: Coordination is intact.     Gait: Gait is intact.  Psychiatric:        Attention and Perception: Attention normal.        Mood and Affect: Affect is angry.        Speech: Speech normal.        Behavior: Behavior is cooperative.       EKG- NSR at 95 bpm.  No changes compared with EKG from 06/04/19   Results for orders placed or performed during the hospital encounter of 06/02/19  Protime-INR  Result Value Ref Range   Prothrombin Time 11.5 11.4 - 15.2 seconds   INR 0.8 0.8 - 1.2  APTT  Result Value Ref Range   aPTT 30 24 - 36 seconds  CBC  Result Value Ref Range   WBC 10.3 4.0 - 10.5 K/uL   RBC 4.72 4.22 - 5.81 MIL/uL   Hemoglobin 14.3 13.0 - 17.0 g/dL   HCT 81.4 48.1 - 85.6 %   MCV 91.9 80.0 - 100.0 fL   MCH 30.3 26.0 - 34.0  pg   MCHC 32.9 30.0 - 36.0 g/dL   RDW 31.4 97.0 - 26.3 %   Platelets 179 150 - 400 K/uL   nRBC 0.0 0.0 - 0.2 %  Differential  Result Value Ref Range   Neutrophils Relative % 58 %   Neutro Abs 6.0 1.7 - 7.7 K/uL   Lymphocytes Relative 33 %   Lymphs Abs 3.4 0.7 - 4.0 K/uL   Monocytes Relative 6 %   Monocytes Absolute 0.7 0.1 - 1.0 K/uL   Eosinophils Relative 1 %   Eosinophils Absolute 0.1 0.0 - 0.5 K/uL   Basophils Relative 1 %   Basophils Absolute 0.1 0.0 - 0.1 K/uL   Immature Granulocytes 1 %   Abs Immature Granulocytes 0.08 (H) 0.00 - 0.07 K/uL  Comprehensive metabolic panel  Result Value Ref Range   Sodium 141 135 - 145 mmol/L   Potassium 3.7 3.5 - 5.1 mmol/L   Chloride 105 98 - 111 mmol/L   CO2 28 22 - 32 mmol/L   Glucose, Bld 326 (H) 70 - 99 mg/dL   BUN 17 6 - 20 mg/dL   Creatinine, Ser 7.85 0.61 - 1.24 mg/dL   Calcium 9.0 8.9 - 88.5 mg/dL   Total Protein 6.6 6.5 - 8.1 g/dL   Albumin 3.7 3.5 - 5.0 g/dL   AST 15 15 - 41 U/L   ALT 28 0 - 44 U/L   Alkaline Phosphatase 91 38 - 126 U/L   Total Bilirubin 0.6 0.3 - 1.2 mg/dL   GFR calc non Af Amer >60 >60 mL/min   GFR calc Af Amer >60 >60 mL/min   Anion gap 8 5 - 15  Glucose, capillary  Result Value Ref Range   Glucose-Capillary 322 (H) 70 - 99 mg/dL      Assessment & Plan:    Encounter Diagnoses  Name Primary?  . Encounter to establish care Yes  . Uncontrolled type 2 diabetes mellitus with hyperglycemia (HCC)   . Tachycardia   . Tobacco use  disorder   . Mood disorder (HCC)       -Check a1c, lipids, microalbumin.  tsh -start jauvia -samples given- rx sent to [facility] -Add aspirn 81mg  daily in light of recent TIA -pt to Stop random insulin -Pt to monitor bs- he is to call office for fbs < 70 or > 300 -Will monitor bp -will schedule pt for influenza immunization -encouraged smoking cessation.  Will discuss further cessation options at subsequent appointments -pt to follow up 1 month.  He is to contact  office sooner prn

## 2019-06-14 ENCOUNTER — Other Ambulatory Visit (HOSPITAL_COMMUNITY)
Admission: RE | Admit: 2019-06-14 | Discharge: 2019-06-14 | Disposition: A | Discharge status: Home / Self Care | Payer: Self-pay | Source: Ambulatory Visit | Attending: Physician Assistant | Admitting: Physician Assistant

## 2019-06-14 DIAGNOSIS — R000 Tachycardia, unspecified: Secondary | ICD-10-CM | POA: Insufficient documentation

## 2019-06-14 DIAGNOSIS — E1165 Type 2 diabetes mellitus with hyperglycemia: Secondary | ICD-10-CM | POA: Insufficient documentation

## 2019-06-14 LAB — HEMOGLOBIN A1C
Hgb A1c MFr Bld: 10.4 % — ABNORMAL HIGH (ref 4.8–5.6)
Mean Plasma Glucose: 251.78 mg/dL

## 2019-06-14 LAB — LIPID PANEL
Cholesterol: 153 mg/dL (ref 0–200)
HDL: 47 mg/dL (ref >40)
LDL Cholesterol: 77 mg/dL (ref 0–99)
Total CHOL/HDL Ratio: 3.3 RATIO
Triglycerides: 147 mg/dL (ref <150)
VLDL: 29 mg/dL (ref 0–40)

## 2019-06-14 LAB — TSH: TSH: 2.404 u[IU]/mL (ref 0.350–4.500)

## 2019-06-15 LAB — MICROALBUMIN, URINE: Microalb, Ur: 11.5 ug/mL — ABNORMAL HIGH

## 2019-06-16 ENCOUNTER — Emergency Department (HOSPITAL_COMMUNITY): Payer: Self-pay

## 2019-06-16 ENCOUNTER — Emergency Department (HOSPITAL_COMMUNITY)
Admission: EM | Admit: 2019-06-16 | Discharge: 2019-06-16 | Disposition: A | Discharge status: Home / Self Care | Payer: Self-pay | Attending: Emergency Medicine | Admitting: Emergency Medicine

## 2019-06-16 ENCOUNTER — Other Ambulatory Visit: Payer: Self-pay

## 2019-06-16 DIAGNOSIS — J111 Influenza due to unidentified influenza virus with other respiratory manifestations: Secondary | ICD-10-CM

## 2019-06-16 DIAGNOSIS — F17210 Nicotine dependence, cigarettes, uncomplicated: Secondary | ICD-10-CM | POA: Insufficient documentation

## 2019-06-16 DIAGNOSIS — E119 Type 2 diabetes mellitus without complications: Secondary | ICD-10-CM | POA: Insufficient documentation

## 2019-06-16 DIAGNOSIS — Z20828 Contact with and (suspected) exposure to other viral communicable diseases: Secondary | ICD-10-CM | POA: Insufficient documentation

## 2019-06-16 DIAGNOSIS — R6883 Chills (without fever): Secondary | ICD-10-CM | POA: Insufficient documentation

## 2019-06-16 DIAGNOSIS — R5383 Other fatigue: Secondary | ICD-10-CM | POA: Insufficient documentation

## 2019-06-16 DIAGNOSIS — Z20822 Contact with and (suspected) exposure to covid-19: Secondary | ICD-10-CM

## 2019-06-16 DIAGNOSIS — R05 Cough: Secondary | ICD-10-CM | POA: Insufficient documentation

## 2019-06-16 DIAGNOSIS — Z7982 Long term (current) use of aspirin: Secondary | ICD-10-CM | POA: Insufficient documentation

## 2019-06-16 LAB — SARS CORONAVIRUS 2 (TAT 6-24 HRS): SARS Coronavirus 2: NEGATIVE

## 2019-06-16 LAB — INFLUENZA PANEL BY PCR (TYPE A & B)
Influenza A By PCR: NEGATIVE
Influenza B By PCR: NEGATIVE

## 2019-06-16 LAB — CBG MONITORING, ED
Glucose-Capillary: 320 mg/dL — ABNORMAL HIGH (ref 70–99)
Glucose-Capillary: 264 mg/dL — ABNORMAL HIGH (ref 70–99)

## 2019-06-16 NOTE — ED Triage Notes (Signed)
Pt reports new cough, generalized body aches, fevers at home, headache, stuffy nose, no loss of taste or smell.

## 2019-06-16 NOTE — ED Provider Notes (Signed)
[facility] EMERGENCY DEPARTMENT Provider Note   CSN: [id number] Arrival date & time: 06/16/19  0009     History   Chief Complaint Chief Complaint  Patient presents with  . Generalized Body Aches    HPI Don Owens is a 43 y.o. male.     Patient with history of diabetes, started on Januvia 3 days ago presenting with a 3-day history of generalized chills and hot and cold flashes without documented fever.  States his temperature was 99 at home.  Over the past 1 day he has developed cough that is nonproductive felt ongoing ongoing chills and hot and cold flashes.  No documented fever.  No chest pain or shortness of breath.  No abdominal pain, nausea or vomiting.  No significant headache or vision change.  No sick contacts.  His family was concerned he could have coronavirus but denies any new exposures.  He googled the side effects of Januvia and it said that chills was 1 of them and he was wondering if that is what is causing his symptoms. Denies any known coronavirus exposures.  Not gotten the flu shot.  No chest pain or shortness of breath.  The history is provided by the patient.    Past Medical History:  Diagnosis Date  . Diabetes mellitus    dx age 43  . PTSD (post-traumatic stress disorder)     Patient Active Problem List   Diagnosis Date Noted  . Excessive anger 02/24/2012  . Head injury, closed, with LOC of unknown duration (HCC) 02/24/2012  . H/O head injury 02/24/2012    No past surgical history on file.      Home Medications    Prior to Admission medications   Medication Sig Start Date End Date Taking? Authorizing Provider  aspirin EC 81 MG tablet Take 1 tablet (81 mg total) by mouth daily. 06/13/19   Jacquelin HawkingMcElroy, Shannon, PA-C  famotidine (PEPCID AC) 10 MG chewable tablet Chew 1 tablet (10 mg total) by mouth 2 (two) times daily. For control of stomach acid secretion and helps GERD. 02/28/12   Mike CrazeWalker, Edwin O, MD  Multiple Vitamin (MULTIVITAMIN) capsule Take 1  capsule by mouth daily.    [provider]  sitaGLIPtin (JANUVIA) 100 MG tablet Take 1 tablet (100 mg total) by mouth daily. 06/13/19   Jacquelin HawkingMcElroy, Shannon, PA-C    Family History Family History  Problem Relation Age of Onset  . Heart disease Mother   . Diabetes Mother     Social History Social History   Tobacco Use  . Smoking status: Current Every Day Smoker    Packs/day: 0.75    Types: Cigarettes  . Smokeless tobacco: Never Used  Substance Use Topics  . Alcohol use: Yes    Comment: currently 1 drink/mo.  hx heavy etoh  . Drug use: Not Currently    Types: Marijuana, Cocaine     Allergies   Patient has no known allergies.   Review of Systems Review of Systems  Constitutional: Positive for chills and fatigue. Negative for activity change, appetite change and fever.  HENT: Negative for congestion and rhinorrhea.   Eyes: Negative for visual disturbance.  Respiratory: Positive for cough. Negative for chest tightness and shortness of breath.   Cardiovascular: Negative for chest pain.  Gastrointestinal: Negative for abdominal pain, nausea and vomiting.  Genitourinary: Negative for dysuria and hematuria.  Musculoskeletal: Positive for arthralgias and myalgias.  Neurological: Positive for weakness. Negative for light-headedness and numbness.    all other systems  are negative except as noted in the HPI and PMH.    Physical Exam Updated Vital Signs BP (!) 134/97 (BP Location: Right Arm)   Pulse 99   Temp 98.1 F (36.7 C) (Oral)   Resp 15   Ht 5\' 11"  (1.803 m)   Wt 111.1 kg   SpO2 100%   BMI 34.17 kg/m   Physical Exam Vitals signs and nursing note reviewed.  Constitutional:      General: He is not in acute distress.    Appearance: He is well-developed. He is obese.     Comments: No distress, speaking full sentences  HENT:     Head: Normocephalic and atraumatic.     Mouth/Throat:     Pharynx: No oropharyngeal exudate.  Eyes:     Conjunctiva/sclera:  Conjunctivae normal.     Pupils: Pupils are equal, round, and reactive to light.  Neck:     Musculoskeletal: Normal range of motion and neck supple.     Comments: No meningismus. Cardiovascular:     Rate and Rhythm: Normal rate and regular rhythm.     Heart sounds: Normal heart sounds. No murmur.  Pulmonary:     Effort: Pulmonary effort is normal. No respiratory distress.     Breath sounds: Normal breath sounds. No wheezing.  Abdominal:     Palpations: Abdomen is soft.     Tenderness: There is no abdominal tenderness. There is no guarding or rebound.  Musculoskeletal: Normal range of motion.        General: No tenderness.  Skin:    General: Skin is warm.     Capillary Refill: Capillary refill takes less than 2 seconds.  Neurological:     General: No focal deficit present.     Mental Status: He is alert and oriented to person, place, and time. Mental status is at baseline.     Cranial Nerves: No cranial nerve deficit.     Motor: No abnormal muscle tone.     Coordination: Coordination normal.     Comments: No ataxia on finger to nose bilaterally. No pronator drift. 5/5 strength throughout. CN 2-12 intact.Equal grip strength. Sensation intact.   Psychiatric:        Behavior: Behavior normal.      ED Treatments / Results  Labs (all labs ordered are listed, but only abnormal results are displayed) Labs Reviewed  CBG MONITORING, ED - Abnormal; Notable for the following components:      Result Value   Glucose-Capillary 320 (*)    All other components within normal limits  CBG MONITORING, ED - Abnormal; Notable for the following components:   Glucose-Capillary 264 (*)    All other components within normal limits  SARS CORONAVIRUS 2 (TAT 6-24 HRS)  INFLUENZA PANEL BY PCR (TYPE A & B)    EKG EKG Interpretation  Date/Time:  Saturday June 16 2019 02:06:28 EDT Ventricular Rate:  93 PR Interval:    QRS Duration: 107 QT Interval:  369 QTC Calculation: 459 R Axis:   126  Text Interpretation:  Sinus rhythm Borderline prolonged PR interval Right axis deviation Borderline T abnormalities, inferior leads Borderline ST elevation, lateral leads No significant change was found Confirmed by Ezequiel Essex [phone]) on 06/16/2019 2:13:03 AM   Radiology Dg Chest Portable 1 View  Result Date: 06/16/2019 CLINICAL DATA:  Cough EXAM: PORTABLE CHEST 1 VIEW COMPARISON:  July 29, 2018 FINDINGS: The heart size and mediastinal contours are within normal limits. Streaky atelectasis seen within the right lower lung.  The visualized skeletal structures are unremarkable. IMPRESSION: Streaky atelectasis in the right lower lung Electronically Signed   By: Jonna Clark M.D.   On: 06/16/2019 02:21    Procedures Procedures (including critical care time)  Medications Ordered in ED Medications - No data to display   Initial Impression / Assessment and Plan / ED Course  I have reviewed the triage vital signs and the nursing notes.  Pertinent labs & imaging results that were available during my care of the patient were reviewed by me and considered in my medical decision making (see chart for details).       3 days of chills without fever now with headache, body aches and cough.  CBG 320.  Nonfocal neurological exam.  Well-appearing without difficulty breathing or swallowing. Chest x-ray is negative.  Will swab for coronavirus as well as check CBG and chest x-ray.  Patient tolerating p.o. and ambulatory.  He is informed of his negative results and coronavirus test pending.  Advised to keep himself quarantined at home while his results are pending and follow-up his results online.  Follow-up with his doctor regarding the side effects of Januvia and continue to monitor his blood sugars he is been doing.  Return precautions discussed.  Don Owens was evaluated in Emergency Department on 06/16/2019 for the symptoms described in the history of present illness. He was evaluated  in the context of the global COVID-19 pandemic, which necessitated consideration that the patient might be at risk for infection with the SARS-CoV-2 virus that causes COVID-19. Institutional protocols and algorithms that pertain to the evaluation of patients at risk for COVID-19 are in a state of rapid change based on information released by regulatory bodies including the CDC and federal and state organizations. These policies and algorithms were followed during the patient's care in the ED.   Final Clinical Impressions(s) / ED Diagnoses   Final diagnoses:  Influenza-like illness  Suspected COVID-19 virus infection    ED Discharge Orders    None       Kingsley Herandez, Jeannett Senior, MD 06/16/19 [phone]

## 2019-06-16 NOTE — Discharge Instructions (Signed)
Keep yourself hydrated.  Use Tylenol or ibuprofen as needed for aches and fever.  Follow-up with your doctor regarding your blood sugars as well as her medication.  It is possible that your medication Januvia could be contributing to your body aches and chills.  However we should treat you as if you do have coronavirus and keep yourself quarantined at home until you are feeling better.  The results of your test should be available in several days online. Return to the ED if you develop chest pain, shortness of breath, any other concerns

## 2019-07-12 ENCOUNTER — Other Ambulatory Visit: Payer: Self-pay

## 2019-07-12 ENCOUNTER — Ambulatory Visit: Payer: Self-pay | Admitting: Physician Assistant

## 2019-07-12 ENCOUNTER — Encounter: Payer: Self-pay | Admitting: Physician Assistant

## 2019-07-12 VITALS — BP 122/86 | HR 91 | Temp 97.9°F

## 2019-07-12 DIAGNOSIS — F17200 Nicotine dependence, unspecified, uncomplicated: Secondary | ICD-10-CM

## 2019-07-12 DIAGNOSIS — Z2821 Immunization not carried out because of patient refusal: Secondary | ICD-10-CM

## 2019-07-12 DIAGNOSIS — E1165 Type 2 diabetes mellitus with hyperglycemia: Secondary | ICD-10-CM

## 2019-07-12 DIAGNOSIS — N4889 Other specified disorders of penis: Secondary | ICD-10-CM

## 2019-07-12 DIAGNOSIS — F39 Unspecified mood [affective] disorder: Secondary | ICD-10-CM

## 2019-07-12 NOTE — Progress Notes (Signed)
BP 122/86   Pulse 91   Temp 97.9 F (36.6 C)   SpO2 97%    Subjective:    Patient ID: Don Owens, male    DOB: [birth year], 43 y.o.   MRN: [id number]  HPI: Don Owens is a 43 y.o. male presenting on 07/12/2019 for No chief complaint on file.   HPI   Pt had a neagtive covid 20 screening questionnaire   Pt is using no medications For his diabetes at this time.  He says he stopped the [place] because it made him have cold symptoms.  He was seen in ER for this on 06/16/19.    Pt didn't get flu shot  Pt tried vaping, patches, lozenges but is still smoking.  He says he would like to be a non-smoker  Pt has significant history of depression and anger issues.  He was Convicted and incarcerated of anger crimes.    He has not had any counseling recently.  He says last time he received mental health care was in 2013.    Pt reports having a lump at the base of his penis that he noticed about a month ago.  He is not having any skin lesions or penile discharge.  He says the lump Only hurts during intercourse.  He does not wear comdoms but says he has been with the same partner for 4 years   Relevant past medical, surgical, family and social history reviewed and updated as indicated. Interim medical history since our last visit reviewed. Allergies and medications reviewed and updated.    Current Outpatient Medications:  .  aspirin EC 81 MG tablet, Take 1 tablet (81 mg total) by mouth daily., Disp:  , Rfl:  .  Cyanocobalamin (VITAMIN B 12 PO), Take by mouth., Disp: , Rfl:  .  famotidine (PEPCID AC) 10 MG chewable tablet, Chew 1 tablet (10 mg total) by mouth 2 (two) times daily. For control of stomach acid secretion and helps GERD., Disp: 60 tablet, Rfl: 0 .  Multiple Vitamin (MULTIVITAMIN) capsule, Take 1 capsule by mouth daily., Disp: , Rfl:  .  sitaGLIPtin (JANUVIA) 100 MG tablet, Take 1 tablet (100 mg total) by mouth daily. (Patient not taking: Reported on 07/12/2019), Disp: 90  tablet, Rfl: 0   Review of Systems  Per HPI unless specifically indicated above     Objective:    BP 122/86   Pulse 91   Temp 97.9 F (36.6 C)   SpO2 97%   Wt Readings from Last 3 Encounters:  06/16/19 245 lb (111.1 kg)  06/01/19 244 lb (110.7 kg)  02/16/16 280 lb (127 kg)    Physical Exam Vitals signs reviewed.  Constitutional:      General: He is not in acute distress.    Appearance: He is well-developed. He is obese. He is not ill-appearing.  HENT:     Head: Normocephalic and atraumatic.  Neck:     Musculoskeletal: Neck supple.  Cardiovascular:     Rate and Rhythm: Normal rate and regular rhythm.  Pulmonary:     Effort: Pulmonary effort is normal.     Breath sounds: Normal breath sounds. No wheezing.  Abdominal:     General: Bowel sounds are normal.     Palpations: Abdomen is soft.     Tenderness: There is no abdominal tenderness.  Genitourinary:    Comments: No mass easily palpable at base of penis.  Palpation somewhat limited due to onset erection.   No skin lesions  or discharge seen.  Lymphadenopathy:     Cervical: No cervical adenopathy.  Skin:    General: Skin is warm and dry.  Neurological:     Mental Status: He is alert and oriented to person, place, and time.  Psychiatric:        Attention and Perception: Attention normal.        Mood and Affect: Affect is angry.        Speech: Speech normal.        Behavior: Behavior is cooperative.         Assessment & Plan:    1. diabetes  -discussed with pt that it is likely he had a virus and was not feeling that way due to the [place].  Discussed the situation and he will have another Trial of [place].  Pt to otify office if he can't take it   2. Tobacco use disorder and 3. Mood disorder  -discussed Smoking cessation.  Discussed risks and benefits of chantix.  Discussed that in light of his significant mental health history, he needs to be evaluated by mental health specialist prior to taking the  medication.   He is given contact information for [facility] and is to go there for evaluation.  Pt states understanding and agrees  Pt to call Nurse to discuss [facility] problems next week (as nurse is out of office today)  4. Penile mass  Will get [place] penis Pt is given application for [facility] [employer] [facility] financial assistance   Pt to have follow up appointment in 1 month.  He is to contact office sooner prn

## 2019-07-12 NOTE — Patient Instructions (Signed)
Varenicline oral tablets What is this medicine? VARENICLINE (var e NI kleen) is used to help people quit smoking. It is used with a patient support program recommended by your physician. This medicine may be used for other purposes; ask your health care provider or pharmacist if you have questions. COMMON BRAND NAME(S): Chantix What should I tell my health care provider before I take this medicine? They need to know if you have any of these conditions:  heart disease  if you often drink alcohol  kidney disease  mental illness  on hemodialysis  seizures  history of stroke  suicidal thoughts, plans, or attempt; a previous suicide attempt by you or a family member  an unusual or allergic reaction to varenicline, other medicines, foods, dyes, or preservatives  pregnant or trying to get pregnant  breast-feeding How should I use this medicine? Take this medicine by mouth after eating. Take with a full glass of water. Follow the directions on the prescription label. Take your doses at regular intervals. Do not take your medicine more often than directed. There are 3 ways you can use this medicine to help you quit smoking; talk to your health care professional to decide which plan is right for you: 1) you can choose a quit date and start this medicine 1 week before the quit date, or, 2) you can start taking this medicine before you choose a quit date, and then pick a quit date between day 8 and 35 days of treatment, or, 3) if you are not sure that you are able or willing to quit smoking right away, start taking this medicine and slowly decrease the amount you smoke as directed by your health care professional with the goal of being cigarette-free by week 12 of treatment. Stick to your plan; ask about support groups or other ways to help you remain cigarette-free. If you are motivated to quit smoking and did not succeed during a previous attempt with this medicine for reasons other than  side effects, or if you returned to smoking after this treatment, speak with your health care professional about whether another course of this medicine may be right for you. A special MedGuide will be given to you by the pharmacist with each prescription and refill. Be sure to read this information carefully each time. Talk to your pediatrician regarding the use of this medicine in children. This medicine is not approved for use in children. Overdosage: If you think you have taken too much of this medicine contact a poison control center or emergency room at once. NOTE: This medicine is only for you. Do not share this medicine with others. What if I miss a dose? If you miss a dose, take it as soon as you can. If it is almost time for your next dose, take only that dose. Do not take double or extra doses. What may interact with this medicine?  alcohol  insulin  other medicines used to help people quit smoking  theophylline  warfarin This list may not describe all possible interactions. Give your health care provider a list of all the medicines, herbs, non-prescription drugs, or dietary supplements you use. Also tell them if you smoke, drink alcohol, or use illegal drugs. Some items may interact with your medicine. What should I watch for while using this medicine? It is okay if you do not succeed at your attempt to quit and have a cigarette. You can still continue your quit attempt and keep using this medicine as directed.   Just throw away your cigarettes and get back to your quit plan. Talk to your health care provider before using other treatments to quit smoking. Using this medicine with other treatments to quit smoking may increase the risk for side effects compared to using a treatment alone. You may get drowsy or dizzy. Do not drive, use machinery, or do anything that needs mental alertness until you know how this medicine affects you. Do not stand or sit up quickly, especially if you are  an older patient. This reduces the risk of dizzy or fainting spells. Decrease the number of alcoholic beverages that you drink during treatment with this medicine until you know if this medicine affects your ability to tolerate alcohol. Some people have experienced increased drunkenness (intoxication), unusual or sometimes aggressive behavior, or no memory of things that have happened (amnesia) during treatment with this medicine. Sleepwalking can happen during treatment with this medicine, and can sometimes lead to behavior that is harmful to you, other people, or property. Stop taking this medicine and tell your doctor if you start sleepwalking or have other unusual sleep-related activity. After taking this medicine, you may get up out of bed and do an activity that you do not know you are doing. The next morning, you may have no memory of this. Activities include driving a car ("sleep-driving"), making and eating food, talking on the phone, sexual activity, and sleep-walking. Serious injuries have occurred. Stop the medicine and call your doctor right away if you find out you have done any of these activities. Do not take this medicine if you have used alcohol that evening. Do not take it if you have taken another medicine for sleep. The risk of doing these sleep-related activities is higher. Patients and their families should watch out for new or worsening depression or thoughts of suicide. Also watch out for sudden changes in feelings such as feeling anxious, agitated, panicky, irritable, hostile, aggressive, impulsive, severely restless, overly excited and hyperactive, or not being able to sleep. If this happens, call your health care professional. If you have diabetes and you quit smoking, the effects of insulin may be increased and you may need to reduce your insulin dose. Check with your doctor or health care professional about how you should adjust your insulin dose. What side effects may I notice  from receiving this medicine? Side effects that you should report to your doctor or health care professional as soon as possible:  allergic reactions like skin rash, itching or hives, swelling of the face, lips, tongue, or throat  acting aggressive, being angry or violent, or acting on dangerous impulses  breathing problems  changes in emotions or moods  chest pain or chest tightness  feeling faint or lightheaded, falls  hallucination, loss of contact with reality  mouth sores  redness, blistering, peeling or loosening of the skin, including inside the mouth  signs and symptoms of a stroke like changes in vision; confusion; trouble speaking or understanding; severe headaches; sudden numbness or weakness of the face, arm or leg; trouble walking; dizziness; loss of balance or coordination  seizures  sleepwalking  suicidal thoughts or other mood changes Side effects that usually do not require medical attention (report to your doctor or health care professional if they continue or are bothersome):  constipation  gas  headache  nausea, vomiting  strange dreams  trouble sleeping This list may not describe all possible side effects. Call your doctor for medical advice about side effects. You may report side   effects to FDA at 1-800-FDA-1088. Where should I keep my medicine? Keep out of the reach of children. Store at room temperature between 15 and 30 degrees C (59 and 86 degrees F). Throw away any unused medicine after the expiration date. NOTE: This sheet is a summary. It may not cover all possible information. If you have questions about this medicine, talk to your doctor, pharmacist, or health care provider.  2020 Elsevier/Gold Standard (2018-08-18 14:27:36)  

## 2019-07-19 ENCOUNTER — Ambulatory Visit (HOSPITAL_COMMUNITY): Admission: RE | Admit: 2019-07-19 | Payer: Self-pay | Source: Ambulatory Visit

## 2019-07-23 ENCOUNTER — Encounter: Payer: Self-pay | Admitting: Physician Assistant

## 2019-07-24 ENCOUNTER — Ambulatory Visit (HOSPITAL_COMMUNITY): Payer: Self-pay

## 2019-08-06 ENCOUNTER — Telehealth: Payer: Self-pay | Admitting: Student

## 2019-08-06 ENCOUNTER — Other Ambulatory Visit: Payer: Self-pay | Admitting: Physician Assistant

## 2019-08-06 MED ORDER — SITAGLIPTIN PHOSPHATE 100 MG PO TABS: 100.0000 mg | ORAL_TABLET | Freq: Every day | ORAL | 0 refills | Status: DC

## 2019-08-06 NOTE — Telephone Encounter (Signed)
LPN called pt on [phone] and left voicemail with patient letting him know samples for his medication are available at the office and he can come by today, 08-06-19 or tomorrow 08-07-19 this week as the office will be closed on 08-08-19 and 08-09-19 for Thanksgiving Holiday. LPN also informed pt via voicemail that rx will be sent to [facility] so he can get in the mail. LPN asked for patient to call back.

## 2019-08-06 NOTE — Telephone Encounter (Signed)
-----   Message from Mayra Reel, RN sent at 08/03/2019 11:56 AM EST ----- Regarding: [facility] Hi Illene Bolus,  I spoke with Mr. Rahmani yesterday. Ulanie had resent his unemployment page. I called [facility] yesterday and he is active until 05/14/2020.  He said he is down to only 7 pills. I called him back to confirm which medicine. I assume it is the Januvia.   I wanted to let you know he is active now and any scripts he needs through [facility] can be sent and hopefully he gets them soon.    Thanks, Wannetta Sender

## 2019-08-06 NOTE — Telephone Encounter (Signed)
medication Received: 3 days ago Message Contents  Mayra Reel, RN  [place], LPN        Hi Illene Bolus,   UPDATE:  I talked with Barnie again this morning. It is his Januvia that he will need from [facility]. He has 5 days total left from the sample given him. I know with thanksgiving mail may not run to get them before he runs out.  Would he be able to possibly pick up another sample Monday or Tuesday before you guys close for the Holiday? If so, would you please let him know.   Thanks Gavin Potters

## 2019-08-13 ENCOUNTER — Ambulatory Visit: Payer: Self-pay | Admitting: Physician Assistant

## 2019-08-13 DIAGNOSIS — N4889 Other specified disorders of penis: Secondary | ICD-10-CM

## 2019-08-13 DIAGNOSIS — F39 Unspecified mood [affective] disorder: Secondary | ICD-10-CM

## 2019-08-13 DIAGNOSIS — E1165 Type 2 diabetes mellitus with hyperglycemia: Secondary | ICD-10-CM

## 2019-08-13 DIAGNOSIS — F17200 Nicotine dependence, unspecified, uncomplicated: Secondary | ICD-10-CM

## 2019-08-13 NOTE — Progress Notes (Signed)
There were no vitals taken for this visit.   Subjective:    Patient ID: Don Owens, male    DOB: [birth year], 43 y.o.   MRN: [id number]  HPI: Don Owens is a 43 y.o. male presenting on 08/13/2019 for No chief complaint on file.   HPI  This is a telemedicine appointment through [employer] due to coronavirus pandemic.  I connected with  Don Owens on 08/13/19 by a video enabled telemedicine application and verified that I am speaking with the correct person using two identifiers.   I discussed the limitations of evaluation and management by telemedicine. The patient expressed understanding and agreed to proceed.  Pt is at home.  Provider is at office   Is a 43 year old male with uncontrolled diabetes.  His appointment today is to follow-up for this.  Pt says he is doing okay with the [place] now.    Pt taking IBU 2 or 3 times daily for back pains.   Ultrasound was scheduled to evaluate a bump at the base of his penis but the ultrasound was not done as the nurse was unable to get the patient to answer the phone to give him the appointment information.   Pt says his telephone was disconnected for a while since last OV. - he says he now has 2 bumps at the base of his penis.  Pt hasn't gotten meds from [facility] yet but says they are on their way  He has appt at [facility] tomorrow for mental health issues.   Relevant past medical, surgical, family and social history reviewed and updated as indicated. Interim medical history since our last visit reviewed. Allergies and medications reviewed and updated.   Current Outpatient Medications:  .  aspirin EC 81 MG tablet, Take 1 tablet (81 mg total) by mouth daily., Disp:  , Rfl:  .  Cyanocobalamin (VITAMIN B 12 PO), Take by mouth., Disp: , Rfl:  .  famotidine (PEPCID AC) 10 MG chewable tablet, Chew 1 tablet (10 mg total) by mouth 2 (two) times daily. For control of stomach acid secretion and helps GERD., Disp: 60 tablet, Rfl: 0 .   ibuprofen (ADVIL) 200 MG tablet, Take 200 mg by mouth every 6 (six) hours as needed., Disp: , Rfl:  .  Multiple Vitamin (MULTIVITAMIN) capsule, Take 1 capsule by mouth daily., Disp: , Rfl:  .  sitaGLIPtin (JANUVIA) 100 MG tablet, Take 1 tablet (100 mg total) by mouth daily., Disp: 90 tablet, Rfl: 0    Review of Systems  Per HPI unless specifically indicated above     Objective:    There were no vitals taken for this visit.  Wt Readings from Last 3 Encounters:  06/16/19 245 lb (111.1 kg)  06/01/19 244 lb (110.7 kg)  02/16/16 280 lb (127 kg)    Physical Exam Constitutional:      General: He is not in acute distress.    Appearance: He is obese. He is not ill-appearing.  HENT:     Head: Normocephalic and atraumatic.  Pulmonary:     Effort: Pulmonary effort is normal. No respiratory distress.  Neurological:     Mental Status: He is alert and oriented to person, place, and time.  Psychiatric:        Attention and Perception: Attention normal.        Speech: Speech normal.          Assessment & Plan:    1.  Uncontrolled diabetes Patient to continue [place] and  he is encouraged to follow a diabetic diet.  2.  Mental health issues Patient is encouraged to keep appointment scheduled for tomorrow at [facility]    3.  Penis bump  We will reschedule ultrasound   Patient is to follow-up in 2 months.  He is encouraged to contact office sooner if needed.

## 2019-08-16 ENCOUNTER — Other Ambulatory Visit: Payer: Self-pay

## 2019-08-16 ENCOUNTER — Ambulatory Visit (HOSPITAL_COMMUNITY)
Admission: RE | Admit: 2019-08-16 | Discharge: 2019-08-16 | Disposition: A | Discharge status: Home / Self Care | Payer: Self-pay | Source: Ambulatory Visit | Attending: Physician Assistant | Admitting: Physician Assistant

## 2019-08-16 DIAGNOSIS — N4889 Other specified disorders of penis: Secondary | ICD-10-CM | POA: Insufficient documentation

## 2019-08-22 ENCOUNTER — Encounter: Payer: Self-pay | Admitting: Physician Assistant

## 2019-08-28 ENCOUNTER — Other Ambulatory Visit: Payer: Self-pay | Admitting: Physician Assistant

## 2019-08-28 MED ORDER — SITAGLIPTIN PHOSPHATE 100 MG PO TABS: 100.0000 mg | ORAL_TABLET | Freq: Every day | ORAL | 1 refills | Status: DC

## 2019-10-12 ENCOUNTER — Other Ambulatory Visit (HOSPITAL_COMMUNITY)
Admission: RE | Admit: 2019-10-12 | Discharge: 2019-10-12 | Disposition: A | Discharge status: Home / Self Care | Payer: Self-pay | Source: Ambulatory Visit | Attending: Physician Assistant | Admitting: Physician Assistant

## 2019-10-12 ENCOUNTER — Other Ambulatory Visit: Payer: Self-pay

## 2019-10-12 DIAGNOSIS — E1165 Type 2 diabetes mellitus with hyperglycemia: Secondary | ICD-10-CM | POA: Insufficient documentation

## 2019-10-12 LAB — HEMOGLOBIN A1C
Hgb A1c MFr Bld: 8.3 % — ABNORMAL HIGH (ref 4.8–5.6)
Mean Plasma Glucose: 191.51 mg/dL

## 2019-10-12 LAB — BASIC METABOLIC PANEL
Anion gap: 9 (ref 5–15)
BUN: 17 mg/dL (ref 6–20)
CO2: 26 mmol/L (ref 22–32)
Calcium: 9.3 mg/dL (ref 8.9–10.3)
Chloride: 103 mmol/L (ref 98–111)
Creatinine, Ser: 1.08 mg/dL (ref 0.61–1.24)
GFR calc Af Amer: >60 mL/min (ref >60)
GFR calc non Af Amer: >60 mL/min (ref >60)
Glucose, Bld: 233 mg/dL — ABNORMAL HIGH (ref 70–99)
Potassium: 4.2 mmol/L (ref 3.5–5.1)
Sodium: 138 mmol/L (ref 135–145)

## 2019-10-16 ENCOUNTER — Encounter: Payer: Self-pay | Admitting: Physician Assistant

## 2019-10-16 ENCOUNTER — Ambulatory Visit: Payer: Self-pay | Admitting: Physician Assistant

## 2019-10-16 VITALS — BP 126/73

## 2019-10-16 DIAGNOSIS — M79671 Pain in right foot: Secondary | ICD-10-CM

## 2019-10-16 DIAGNOSIS — N4889 Other specified disorders of penis: Secondary | ICD-10-CM

## 2019-10-16 DIAGNOSIS — F17200 Nicotine dependence, unspecified, uncomplicated: Secondary | ICD-10-CM

## 2019-10-16 DIAGNOSIS — F39 Unspecified mood [affective] disorder: Secondary | ICD-10-CM

## 2019-10-16 DIAGNOSIS — E1165 Type 2 diabetes mellitus with hyperglycemia: Secondary | ICD-10-CM

## 2019-10-16 DIAGNOSIS — M79672 Pain in left foot: Secondary | ICD-10-CM

## 2019-10-16 MED ORDER — METFORMIN HCL 500 MG PO TABS: 500.0000 mg | ORAL_TABLET | Freq: Two times a day (BID) | ORAL | 1 refills | Status: DC

## 2019-10-16 NOTE — Progress Notes (Signed)
BP 126/73    Subjective:    Patient ID: Don Owens, male    DOB: 1976/07/19, 44 y.o.   MRN: [id number]  HPI: Don Owens is a 44 y.o. male presenting on 10/16/2019 for No chief complaint on file.   HPI   This is a telemedicine appointment through [employer] due to coronavirus pandemic.  I connected with  [name] on 10/16/19 by a video enabled telemedicine application and verified that I am speaking with the correct person using two identifiers.   I discussed the limitations of evaluation and management by telemedicine. The patient expressed understanding and agreed to proceed.   Pt says his bp machine not working not working today (?battery?) but says his bp was 126/73 on Saturday  He is going to [facility] for mental health services  He went to urologist.  He was referred for Peyronies.  he wans't seen due to $$-  (went to [facility] office which does not honor the [employer] assistance).    Pt says he feels submitted his application for [facility] financial assistance   Pt requesting pain med for feet pain.  He has had no complaints of this in past.  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   He Says his feet hurt all the time regardless of whether he is standing at work or not.    He says he discussed chantix for smoking cessation with his psychiatrist who recommended that he not use chantix but to use wellbutrin instead.    Psych rec wellbutrin- not chantix     Relevant past medical, surgical, family and social history reviewed and updated as indicated. Interim medical history since our last visit reviewed. Allergies and medications reviewed and updated.   CURRENT MEDS: [place] Citalopram Vit b-12 pepcid advil MVI januvia trazodone      Review of Systems  Per HPI unless specifically indicated above     Objective:    BP 126/73   Wt Readings from Last 3 Encounters:  06/16/19 245 lb (111.1 kg)  06/01/19 244 lb (110.7 kg)  02/16/16 280 lb  (127 kg)    Physical Exam Constitutional:      General: He is not in acute distress.    Appearance: He is obese. He is not ill-appearing.  HENT:     Head: Normocephalic and atraumatic.  Pulmonary:     Effort: Pulmonary effort is normal. No respiratory distress.  Neurological:     Mental Status: He is alert and oriented to person, place, and time.  Psychiatric:        Attention and Perception: Attention normal.        Mood and Affect: Mood is depressed.        Speech: Speech normal.        Behavior: Behavior is cooperative.     Results for orders placed or performed during the hospital encounter of 10/12/19  Basic metabolic panel  Result Value Ref Range   Sodium 138 135 - 145 mmol/L   Potassium 4.2 3.5 - 5.1 mmol/L   Chloride 103 98 - 111 mmol/L   CO2 26 22 - 32 mmol/L   Glucose, Bld 233 (H) 70 - 99 mg/dL   BUN 17 6 - 20 mg/dL   Creatinine, Ser 2.84 0.61 - 1.24 mg/dL   Calcium 9.3 8.9 - 13.2 mg/dL   GFR calc non Af Amer >60 >60 mL/min   GFR calc Af Amer >60 >60 mL/min  Anion gap 9 5 - 15  Hemoglobin A1c  Result Value Ref Range   Hgb A1c MFr Bld 8.3 (H) 4.8 - 5.6 %   Mean Plasma Glucose 191.51 mg/dL      Assessment & Plan:    Encounter Diagnoses  Name Primary?  Marland Kitchen Uncontrolled type 2 diabetes mellitus with hyperglycemia ([place]) Yes  . Penile mass   . Mood disorder ([place])   . Tobacco use disorder   . Pain in both feet      -Reviewed labs with pt -Add metformin  500 bid for uncontrolled DM -pt to Continue with [facility] for [facility] issues -will have pt come in to office for full foot exam although DM foot exam 06/13/19 was normal.  If no abnormalities found, will consider [place] to podiatrist for evaluation and Recommendations.   For now recommended that pt get good supportive shoes and consider getting inserts -will Discuss wellbutrin for smoking at next OV. -will have nurse check on urology referral and see about getting pt appointment in [place] office where [facility]  charity is accepted -pt to follow up F/u 1 month.  He is to contact office sooner prn

## 2019-11-12 ENCOUNTER — Encounter: Payer: Self-pay | Admitting: Physician Assistant

## 2019-11-12 ENCOUNTER — Ambulatory Visit: Payer: Self-pay | Admitting: Physician Assistant

## 2019-11-12 ENCOUNTER — Other Ambulatory Visit: Payer: Self-pay

## 2019-11-12 VITALS — BP 130/80 | HR 110 | Temp 98.8°F | Wt 247.2 lb

## 2019-11-12 DIAGNOSIS — F39 Unspecified mood [affective] disorder: Secondary | ICD-10-CM

## 2019-11-12 DIAGNOSIS — N4889 Other specified disorders of penis: Secondary | ICD-10-CM

## 2019-11-12 DIAGNOSIS — E1165 Type 2 diabetes mellitus with hyperglycemia: Secondary | ICD-10-CM

## 2019-11-12 DIAGNOSIS — E1142 Type 2 diabetes mellitus with diabetic polyneuropathy: Secondary | ICD-10-CM

## 2019-11-12 DIAGNOSIS — F17200 Nicotine dependence, unspecified, uncomplicated: Secondary | ICD-10-CM

## 2019-11-12 MED ORDER — GABAPENTIN 100 MG PO CAPS: 100.0000 mg | ORAL_CAPSULE | Freq: Two times a day (BID) | ORAL | 1 refills | Status: DC | PRN

## 2019-11-12 NOTE — Patient Instructions (Signed)

## 2019-11-12 NOTE — Progress Notes (Signed)
BP 130/80   Pulse (!) 110   Temp 98.8 F (37.1 C)   Wt 247 lb 3.2 oz (112.1 kg)   SpO2 97%   BMI 34.48 kg/m    Subjective:    Patient ID: Don Owens, male    DOB: 1976-08-21, 44 y.o.   MRN: [id number]  HPI: Don Owens is a 44 y.o. male presenting on 11/12/2019 for Diabetes   HPI   Pt had negative covid 19 screening qestionnaire.    Pt is 43yoM with Dm.    He has appointment with Urology in April for penis problem.  Pt was denied for CAFA because he didn't turn in all the required paperwork  He goes to [facility] for MH issues  Pt works at [employer].  He says his Feet still hurting  .pt denies illicit drugs.  He does frequesntly drink energy drinks.  He had a kickstart this morning.     Relevant past medical, surgical, family and social history reviewed and updated as indicated. Interim medical history since our last visit reviewed. Allergies and medications reviewed and updated.   Current Outpatient Medications:  .  aspirin EC 81 MG tablet, Take 1 tablet (81 mg total) by mouth daily., Disp:  , Rfl:  .  citalopram (CELEXA) 20 MG tablet, Take 20 mg by mouth daily., Disp: , Rfl:  .  Cyanocobalamin (VITAMIN B 12 PO), Take by mouth., Disp: , Rfl:  .  famotidine (PEPCID AC) 10 MG chewable tablet, Chew 1 tablet (10 mg total) by mouth 2 (two) times daily. For control of stomach acid secretion and helps GERD., Disp: 60 tablet, Rfl: 0 .  ibuprofen (ADVIL) 200 MG tablet, Take 200 mg by mouth every 6 (six) hours as needed., Disp: , Rfl:  .  loratadine (CLARITIN) 10 MG tablet, Take 10 mg by mouth daily., Disp: , Rfl:  .  metFORMIN (GLUCOPHAGE) 500 MG tablet, Take 1 tablet (500 mg total) by mouth 2 (two) times daily with a meal., Disp: 180 tablet, Rfl: 1 .  sitaGLIPtin (JANUVIA) 100 MG tablet, Take 1 tablet (100 mg total) by mouth daily., Disp: 90 tablet, Rfl: 1 .  traZODone (DESYREL) 100 MG tablet, Take 100 mg by mouth at bedtime., Disp: , Rfl:  .  Multiple Vitamin  (MULTIVITAMIN) capsule, Take 1 capsule by mouth daily., Disp: , Rfl:      Review of Systems  Per HPI unless specifically indicated above     Objective:    BP 130/80   Pulse (!) 110   Temp 98.8 F (37.1 C)   Wt 247 lb 3.2 oz (112.1 kg)   SpO2 97%   BMI 34.48 kg/m   Wt Readings from Last 3 Encounters:  11/12/19 247 lb 3.2 oz (112.1 kg)  06/16/19 245 lb (111.1 kg)  06/01/19 244 lb (110.7 kg)    Physical Exam Vitals reviewed.  Constitutional:      General: He is not in acute distress.    Appearance: He is well-developed. He is obese. He is not ill-appearing.  HENT:     Head: Normocephalic and atraumatic.  Cardiovascular:     Rate and Rhythm: Normal rate and regular rhythm.  Pulmonary:     Effort: Pulmonary effort is normal.     Breath sounds: Normal breath sounds. No wheezing.  Abdominal:     General: Bowel sounds are normal.     Palpations: Abdomen is soft.     Tenderness: There is no abdominal tenderness.  Musculoskeletal:  Cervical back: Neck supple.     Right lower leg: No edema.     Left lower leg: No edema.  Feet:     Comments: Feet normal bilaterally Lymphadenopathy:     Cervical: No cervical adenopathy.  Skin:    General: Skin is warm and dry.  Neurological:     Mental Status: He is alert and oriented to person, place, and time.  Psychiatric:        Mood and Affect: Affect is angry.        Behavior: Behavior normal.           Assessment & Plan:    Encounter Diagnoses  Name Primary?  Marland Kitchen Uncontrolled type 2 diabetes mellitus with hyperglycemia ([place]) Yes  . Mood disorder ([place])   . Tobacco use disorder   . Penile mass   . Diabetic polyneuropathy associated with type 2 diabetes mellitus ([place])      -pt counseled to Avoid walking barefoot -he is encouraged to Avoid energy drinks -rx gabapentin for foot pain -pt to continue with [facility] for mental health problems -pt to follow up  2 months.  He is to contact office sooner prn

## 2019-11-13 ENCOUNTER — Ambulatory Visit: Payer: Self-pay | Admitting: Physician Assistant

## 2019-12-26 ENCOUNTER — Ambulatory Visit: Payer: Self-pay | Admitting: Urology

## 2020-01-08 ENCOUNTER — Other Ambulatory Visit (HOSPITAL_COMMUNITY)
Admission: RE | Admit: 2020-01-08 | Discharge: 2020-01-08 | Disposition: A | Discharge status: Home / Self Care | Payer: Self-pay | Source: Ambulatory Visit | Attending: Physician Assistant | Admitting: Physician Assistant

## 2020-01-08 ENCOUNTER — Other Ambulatory Visit: Payer: Self-pay

## 2020-01-08 DIAGNOSIS — E1165 Type 2 diabetes mellitus with hyperglycemia: Secondary | ICD-10-CM | POA: Insufficient documentation

## 2020-01-08 LAB — BASIC METABOLIC PANEL
Anion gap: 8 (ref 5–15)
BUN: 26 mg/dL — ABNORMAL HIGH (ref 6–20)
CO2: 28 mmol/L (ref 22–32)
Calcium: 9 mg/dL (ref 8.9–10.3)
Chloride: 104 mmol/L (ref 98–111)
Creatinine, Ser: 1.19 mg/dL (ref 0.61–1.24)
GFR calc Af Amer: >60 mL/min (ref >60)
GFR calc non Af Amer: >60 mL/min (ref >60)
Glucose, Bld: 185 mg/dL — ABNORMAL HIGH (ref 70–99)
Potassium: 4.3 mmol/L (ref 3.5–5.1)
Sodium: 140 mmol/L (ref 135–145)

## 2020-01-08 LAB — HEMOGLOBIN A1C
Hgb A1c MFr Bld: 7.2 % — ABNORMAL HIGH (ref 4.8–5.6)
Mean Plasma Glucose: 159.94 mg/dL

## 2020-01-14 ENCOUNTER — Ambulatory Visit: Payer: Self-pay | Admitting: Physician Assistant

## 2020-03-11 ENCOUNTER — Encounter: Payer: Self-pay | Admitting: Student

## 2020-03-11 ENCOUNTER — Ambulatory Visit: Payer: Self-pay | Admitting: Urology

## 2020-04-08 ENCOUNTER — Ambulatory Visit: Payer: Self-pay | Admitting: Physician Assistant

## 2020-04-08 ENCOUNTER — Encounter: Payer: Self-pay | Admitting: Physician Assistant

## 2020-04-08 DIAGNOSIS — F39 Unspecified mood [affective] disorder: Secondary | ICD-10-CM

## 2020-04-08 DIAGNOSIS — E669 Obesity, unspecified: Secondary | ICD-10-CM

## 2020-04-08 DIAGNOSIS — E1165 Type 2 diabetes mellitus with hyperglycemia: Secondary | ICD-10-CM

## 2020-04-08 DIAGNOSIS — Z91199 Patient's noncompliance with other medical treatment and regimen due to unspecified reason: Secondary | ICD-10-CM

## 2020-04-08 DIAGNOSIS — F17200 Nicotine dependence, unspecified, uncomplicated: Secondary | ICD-10-CM

## 2020-04-08 MED ORDER — SITAGLIPTIN PHOSPHATE 100 MG PO TABS: 100.0000 mg | ORAL_TABLET | Freq: Every day | ORAL | 1 refills | Status: DC

## 2020-04-08 MED ORDER — METFORMIN HCL 500 MG PO TABS: 500.0000 mg | ORAL_TABLET | Freq: Two times a day (BID) | ORAL | 1 refills | Status: DC

## 2020-04-08 NOTE — Progress Notes (Signed)
There were no vitals taken for this visit.   Subjective:    Patient ID: Don Owens, male    DOB: [birth year], 44 y.o.   MRN: [id number]  HPI: Don Owens is a 44 y.o. male presenting on 04/08/2020 for No chief complaint on file.   HPI   This is a telemedicine appointment through [employer] due to coronavirus pandemic.  I connected with  Don Owens on 04/08/20 by a video enabled telemedicine application and verified that I am speaking with the correct person using two identifiers.   I discussed the limitations of evaluation and management by telemedicine. The patient expressed understanding and agreed to proceed.  Pt is at home. Provider is at office.    Pt is a 43yoM with DM and MH issues.    [facility].    Pt was seen last month at [facility] ER for N/V and note says pt told [facility] that he wasn't coming here to [facility] any longer.  Pt says they must have misunderstood him.  Pt was contacted about his labs with requests for him to call but he never called back about labs results (from labs done in April).  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  He has not gotten covid vaccination.  Pt says his BP machine that he used at home broke.       Relevant past medical, surgical, family and social history reviewed and updated as indicated. Interim medical history since our last visit reviewed. Allergies and medications reviewed and updated.   Current Outpatient Medications:    aspirin EC 81 MG tablet, Take 1 tablet (81 mg total) by mouth daily., Disp:  , Rfl:    citalopram (CELEXA) 20 MG tablet, Take 20 mg by mouth daily., Disp: , Rfl:    famotidine (PEPCID AC) 10 MG chewable tablet, Chew 1 tablet (10 mg total) by mouth 2 (two) times daily. For control of stomach acid secretion and helps GERD., Disp: 60 tablet, Rfl: 0   loratadine (CLARITIN) 10 MG tablet, Take 10 mg by mouth daily., Disp: , Rfl:     metFORMIN (GLUCOPHAGE) 500 MG tablet, Take 1 tablet (500 mg total) by mouth 2 (two) times daily with a meal., Disp: 180 tablet, Rfl: 1   traZODone (DESYREL) 100 MG tablet, Take 100 mg by mouth at bedtime., Disp: , Rfl:    Cyanocobalamin (VITAMIN B 12 PO), Take by mouth. (Patient not taking: Reported on 04/08/2020), Disp: , Rfl:    gabapentin (NEURONTIN) 100 MG capsule, Take 1 capsule (100 mg total) by mouth 2 (two) times daily as needed. (Patient not taking: Reported on 04/08/2020), Disp: 60 capsule, Rfl: 1   ibuprofen (ADVIL) 200 MG tablet, Take 200 mg by mouth every 6 (six) hours as needed. (Patient not taking: Reported on 04/08/2020), Disp: , Rfl:    Multiple Vitamin (MULTIVITAMIN) capsule, Take 1 capsule by mouth daily. (Patient not taking: Reported on 04/08/2020), Disp: , Rfl:    sitaGLIPtin (JANUVIA) 100 MG tablet, Take 1 tablet (100 mg total) by mouth daily. (Patient not taking: Reported on 04/08/2020), Disp: 90 tablet, Rfl: 1      Review of Systems  Per HPI unless specifically indicated above     Objective:    There were no vitals taken for this visit.  Wt Readings from Last 3 Encounters:  11/12/19 247 lb 3.2 oz (112.1 kg)  06/16/19 245  lb (111.1 kg)  06/01/19 244 lb (110.7 kg)    Physical Exam Constitutional:      General: He is not in acute distress.    Comments: Appears groggy  HENT:     Head: Normocephalic and atraumatic.  Pulmonary:     Effort: No respiratory distress.  Neurological:     Mental Status: He is oriented to person, place, and time.             Assessment & Plan:    Encounter Diagnoses  Name Primary?   Uncontrolled type 2 diabetes mellitus with hyperglycemia ([facility]) Yes   Mood disorder ([facility])    Tobacco use disorder    Noncompliance    Obesity, unspecified classification, unspecified obesity type, unspecified whether serious comorbidity present        -discussed with pt that Labs BEFORE next appt since it would be unhelpful to  draw them now since he isn't taking his meds -discussed with pt importance of him calling back about labs when we leave VM for him (he was also sent a letter) -Pt to call [facility] to make sure they have his correct address -Refills sent -pt encouraged to get covid vaccination.  Spent 4-5 minutes discussing risks/benefits of vaccination and how he can get it -pt to follow up 3 months.  He is to contact office sooner prn

## 2020-05-06 ENCOUNTER — Ambulatory Visit: Payer: Self-pay | Admitting: Urology

## 2020-05-06 NOTE — Progress Notes (Incomplete)
H&P  Chief Complaint: ***  History of Present Illness:  8.24.2021:   Past Medical History:  Diagnosis Date  . Diabetes mellitus    dx age 44  . PTSD (post-traumatic stress disorder)     No past surgical history on file.  Home Medications:  Allergies as of 05/06/2020   No Known Allergies     Medication List       Accurate as of May 06, 2020 11:06 AM. If you have any questions, ask your nurse or doctor.        aspirin EC 81 MG tablet Take 1 tablet (81 mg total) by mouth daily.   citalopram 20 MG tablet Commonly known as: CELEXA Take 20 mg by mouth daily.   famotidine 10 MG chewable tablet Commonly known as: PEPCID AC Chew 1 tablet (10 mg total) by mouth 2 (two) times daily. For control of stomach acid secretion and helps GERD.   gabapentin 100 MG capsule Commonly known as: NEURONTIN Take 1 capsule (100 mg total) by mouth 2 (two) times daily as needed.   ibuprofen 200 MG tablet Commonly known as: ADVIL Take 200 mg by mouth every 6 (six) hours as needed.   loratadine 10 MG tablet Commonly known as: CLARITIN Take 10 mg by mouth daily.   metFORMIN 500 MG tablet Commonly known as: GLUCOPHAGE Take 1 tablet (500 mg total) by mouth 2 (two) times daily with a meal.   multivitamin capsule Take 1 capsule by mouth daily.   sitaGLIPtin 100 MG tablet Commonly known as: Januvia Take 1 tablet (100 mg total) by mouth daily.   traZODone 100 MG tablet Commonly known as: DESYREL Take 100 mg by mouth at bedtime.   VITAMIN B 12 PO Take by mouth.       Allergies: No Known Allergies  Family History  Problem Relation Age of Onset  . Heart disease Mother   . Diabetes Mother     Social History:  reports that he has been smoking cigarettes. He has been smoking about 0.75 packs per day. He has never used smokeless tobacco. He reports current alcohol use. He reports previous drug use. Drugs: Marijuana and Cocaine.  ROS: A complete review of systems was performed.   All systems are negative except for pertinent findings as noted.  Physical Exam:  Vital signs in last 24 hours: There were no vitals taken for this visit. Constitutional:  Alert and oriented, No acute distress Cardiovascular: Regular rate  Respiratory: Normal respiratory effort GI: Abdomen is soft, nontender, nondistended, no abdominal masses. No CVAT.  Genitourinary: Normal male phallus, testes are descended bilaterally and non-tender and without masses, scrotum is normal in appearance without lesions or masses, perineum is normal on inspection. Lymphatic: No lymphadenopathy Neurologic: Grossly intact, no focal deficits Psychiatric: Normal mood and affect  Laboratory Data:  No results for input(s): WBC, HGB, HCT, PLT in the last 72 hours.  No results for input(s): NA, K, CL, GLUCOSE, BUN, CALCIUM, CREATININE in the last 72 hours.  Invalid input(s): CO3   No results found for this or any previous visit (from the past 24 hour(s)). No results found for this or any previous visit (from the past 240 hour(s)).  Renal Function: No results for input(s): CREATININE in the last 168 hours. CrCl cannot be calculated (Patient's most recent lab result is older than the maximum 21 days allowed.).  Radiologic Imaging: No results found.  Impression/Assessment:  ***  Plan:  ***

## 2020-07-07 ENCOUNTER — Ambulatory Visit: Payer: Self-pay | Admitting: Physician Assistant

## 2020-09-02 ENCOUNTER — Emergency Department (HOSPITAL_COMMUNITY)
Admission: EM | Admit: 2020-09-02 | Discharge: 2020-09-02 | Disposition: A | Discharge status: Home / Self Care | Payer: 59 | Attending: Emergency Medicine | Admitting: Emergency Medicine

## 2020-09-02 ENCOUNTER — Other Ambulatory Visit: Payer: Self-pay

## 2020-09-02 ENCOUNTER — Emergency Department (HOSPITAL_COMMUNITY): Payer: 59

## 2020-09-02 ENCOUNTER — Encounter (HOSPITAL_COMMUNITY): Payer: Self-pay

## 2020-09-02 DIAGNOSIS — Z20822 Contact with and (suspected) exposure to covid-19: Secondary | ICD-10-CM | POA: Insufficient documentation

## 2020-09-02 DIAGNOSIS — R059 Cough, unspecified: Secondary | ICD-10-CM | POA: Diagnosis present

## 2020-09-02 DIAGNOSIS — R6883 Chills (without fever): Secondary | ICD-10-CM | POA: Diagnosis not present

## 2020-09-02 DIAGNOSIS — M791 Myalgia, unspecified site: Secondary | ICD-10-CM | POA: Diagnosis not present

## 2020-09-02 DIAGNOSIS — R1032 Left lower quadrant pain: Secondary | ICD-10-CM | POA: Diagnosis not present

## 2020-09-02 DIAGNOSIS — E119 Type 2 diabetes mellitus without complications: Secondary | ICD-10-CM | POA: Insufficient documentation

## 2020-09-02 DIAGNOSIS — Z7982 Long term (current) use of aspirin: Secondary | ICD-10-CM | POA: Diagnosis not present

## 2020-09-02 DIAGNOSIS — J40 Bronchitis, not specified as acute or chronic: Secondary | ICD-10-CM | POA: Diagnosis not present

## 2020-09-02 DIAGNOSIS — F17210 Nicotine dependence, cigarettes, uncomplicated: Secondary | ICD-10-CM | POA: Insufficient documentation

## 2020-09-02 DIAGNOSIS — Z7984 Long term (current) use of oral hypoglycemic drugs: Secondary | ICD-10-CM | POA: Diagnosis not present

## 2020-09-02 LAB — RESP PANEL BY RT-PCR (FLU A&B, COVID) ARPGX2
Influenza A by PCR: NEGATIVE
Influenza B by PCR: NEGATIVE
SARS Coronavirus 2 by RT PCR: NEGATIVE

## 2020-09-02 LAB — CBG MONITORING, ED: Glucose-Capillary: 196 mg/dL — ABNORMAL HIGH (ref 70–99)

## 2020-09-02 MED ORDER — AMOXICILLIN 500 MG PO CAPS: 500.0000 mg | ORAL_CAPSULE | Freq: Three times a day (TID) | ORAL | 0 refills | Status: DC

## 2020-09-02 MED ORDER — DOXYCYCLINE HYCLATE 100 MG PO TABS
100.0000 mg | ORAL_TABLET | Freq: Once | ORAL | Status: AC
  Administered 2020-09-02: 100 mg via ORAL
  Filled 2020-09-02: qty 1

## 2020-09-02 NOTE — ED Notes (Signed)
ED Provider at bedside. 

## 2020-09-02 NOTE — ED Notes (Signed)
Xray at Pts bedside

## 2020-09-02 NOTE — Discharge Instructions (Addendum)
Drink plenty of fluids.  Take the antibiotic until gone.  You can take Mucinex DM over-the-counter for cough.  Recheck if you get a high fever, struggling to breathe, or you get severe chest pain.  Please try to quit smoking.

## 2020-09-02 NOTE — ED Provider Notes (Signed)
[facility] EMERGENCY DEPARTMENT Provider Note   CSN: [id number] Arrival date & time: 09/02/20  0038   Time seen 1:56 AM  History Chief Complaint  Patient presents with  . Generalized Body Aches    Don Owens is a 44 y.o. male.  HPI   Patient states last night, December 19 he started having body aches and he states he had chills this evening.  He has not checked his temperature.  He has had a dry cough.  He states he had some left lower abdominal pain earlier today that has now just become a gaseous feeling.  He ate as usual today.  He denies sore throat, rhinorrhea, vomiting, diarrhea, chest pain, or shortness of breath.  He has had mild nausea.  He denies being around anybody else who is sick.  Patient does smoke a pack a day.  Patient has not had the Covid vaccine  PCP  [place] [facility]  Past Medical History:  Diagnosis Date  . Diabetes mellitus    dx age 7  . PTSD (post-traumatic stress disorder)     Patient Active Problem List   Diagnosis Date Noted  . Excessive anger 02/24/2012  . Head injury, closed, with LOC of unknown duration (HCC) 02/24/2012  . H/O head injury 02/24/2012    History reviewed. No pertinent surgical history.     Family History  Problem Relation Age of Onset  . Heart disease Mother   . Diabetes Mother     Social History   Tobacco Use  . Smoking status: Current Every Day Smoker    Packs/day: 0.75    Types: Cigarettes  . Smokeless tobacco: Never Used  Vaping Use  . Vaping Use: Former  Substance Use Topics  . Alcohol use: Yes    Comment: currently 1 drink/mo.  hx heavy etoh  . Drug use: Not Currently    Types: Marijuana, Cocaine    Home Medications Prior to Admission medications   Medication Sig Start Date End Date Taking? Authorizing Provider  amoxicillin (AMOXIL) 500 MG capsule Take 1 capsule (500 mg total) by mouth 3 (three) times daily. 09/02/20   Devoria Albe, MD  aspirin EC 81 MG tablet Take 1 tablet (81 mg  total) by mouth daily. 06/13/19   Jacquelin Hawking, PA-C  citalopram (CELEXA) 20 MG tablet Take 20 mg by mouth daily.    [provider]  Cyanocobalamin (VITAMIN B 12 PO) Take by mouth. Patient not taking: Reported on 04/08/2020    [provider]  famotidine (PEPCID AC) 10 MG chewable tablet Chew 1 tablet (10 mg total) by mouth 2 (two) times daily. For control of stomach acid secretion and helps GERD. 02/28/12   Mike Craze, MD  gabapentin (NEURONTIN) 100 MG capsule Take 1 capsule (100 mg total) by mouth 2 (two) times daily as needed. Patient not taking: Reported on 04/08/2020 11/12/19   Jacquelin Hawking, PA-C  ibuprofen (ADVIL) 200 MG tablet Take 200 mg by mouth every 6 (six) hours as needed. Patient not taking: Reported on 04/08/2020    [provider]  loratadine (CLARITIN) 10 MG tablet Take 10 mg by mouth daily.    [provider]  metFORMIN (GLUCOPHAGE) 500 MG tablet Take 1 tablet (500 mg total) by mouth 2 (two) times daily with a meal. 04/08/20   Jacquelin Hawking, PA-C  Multiple Vitamin (MULTIVITAMIN) capsule Take 1 capsule by mouth daily. Patient not taking: Reported on 04/08/2020    [provider]  sitaGLIPtin (  JANUVIA) 100 MG tablet Take 1 tablet (100 mg total) by mouth daily. 04/08/20   Jacquelin Hawking, PA-C  traZODone (DESYREL) 100 MG tablet Take 100 mg by mouth at bedtime.    [provider]    Allergies    Patient has no known allergies.  Review of Systems   Review of Systems  All other systems reviewed and are negative.   Physical Exam Updated Vital Signs BP 125/80 (BP Location: Left Arm)   Pulse 80   Temp 98 F (36.7 C) (Oral)   Resp 18   Ht 5\' 11"  (1.803 m)   Wt 119.7 kg   SpO2 99%   BMI 36.82 kg/m   Physical Exam Vitals and nursing note reviewed.  Constitutional:      General: He is not in acute distress.    Appearance: Normal appearance. He is obese. He is not ill-appearing or toxic-appearing.  HENT:      Head: Normocephalic and atraumatic.     Right Ear: External ear normal.     Left Ear: External ear normal.     Nose: Nose normal.     Mouth/Throat:     Mouth: Mucous membranes are dry.     Pharynx: No oropharyngeal exudate or posterior oropharyngeal erythema.  Eyes:     Extraocular Movements: Extraocular movements intact.     Conjunctiva/sclera: Conjunctivae normal.     Pupils: Pupils are equal, round, and reactive to light.  Cardiovascular:     Rate and Rhythm: Normal rate and regular rhythm.     Pulses: Normal pulses.     Heart sounds: Normal heart sounds. No murmur heard.   Pulmonary:     Effort: Pulmonary effort is normal. No respiratory distress.     Breath sounds: Normal breath sounds. No stridor. No wheezing, rhonchi or rales.  Abdominal:     General: Abdomen is flat. Bowel sounds are normal.     Palpations: Abdomen is soft.     Tenderness: There is no abdominal tenderness.  Musculoskeletal:        General: Normal range of motion.     Cervical back: Normal range of motion.  Skin:    General: Skin is warm and dry.  Neurological:     General: No focal deficit present.     Mental Status: He is alert and oriented to person, place, and time.     Cranial Nerves: No cranial nerve deficit.  Psychiatric:        Mood and Affect: Affect is flat.        Behavior: Behavior normal.        Thought Content: Thought content normal.     ED Results / Procedures / Treatments   Labs (all labs ordered are listed, but only abnormal results are displayed) Results for orders placed or performed during the hospital encounter of 09/02/20  Resp Panel by RT-PCR (Flu A&B, Covid) Nasopharyngeal Swab   Specimen: Nasopharyngeal Swab; Nasopharyngeal(NP) swabs in vial transport medium  Result Value Ref Range   SARS Coronavirus 2 by RT PCR NEGATIVE NEGATIVE   Influenza A by PCR NEGATIVE NEGATIVE   Influenza B by PCR NEGATIVE NEGATIVE  CBG monitoring, ED  Result Value Ref Range    Glucose-Capillary 196 (H) 70 - 99 mg/dL   Laboratory interpretation all normal except hyperglycemia    EKG None  Radiology DG Chest Port 1 View  Result Date: 09/02/2020 CLINICAL DATA:  Body aches, cough and fatigue. EXAM: PORTABLE CHEST 1 VIEW COMPARISON:  February 13, 2020 FINDINGS: Very mild areas of linear atelectasis are seen within the lateral aspects of the mid lung fields, bilaterally. There is no evidence of acute infiltrate, pleural effusion or pneumothorax. The heart size and mediastinal contours are within normal limits. The visualized skeletal structures are unremarkable. IMPRESSION: Very mild bilateral linear atelectasis. Electronically Signed   By: Aram Candela M.D.   On: 09/02/2020 01:36    Procedures Procedures (including critical care time)  Medications Ordered in ED Medications  doxycycline (VIBRA-TABS) tablet 100 mg (100 mg Oral Given 09/02/20 0227)    ED Course  I have reviewed the triage vital signs and the nursing notes.  Pertinent labs & imaging results that were available during my care of the patient were reviewed by me and considered in my medical decision making (see chart for details).    MDM Rules/Calculators/A&P                          Patient has respiratory symptoms concerning for Covid however his Covid test is negative as was his influenza test.  Patient is a smoker and he most likely has a bronchitis.  He was started on antibiotics.  He was encouraged to try to quit smoking.  He can take over-the-counter Mucinex DM for his cough.  He should be rechecked if he gets a high fever, struggles to breathe or seems worse.    Final Clinical Impression(s) / ED Diagnoses Final diagnoses:  Bronchitis    Rx / DC Orders ED Discharge Orders         Ordered    amoxicillin (AMOXIL) 500 MG capsule  3 times daily        09/02/20 3300         Plan discharge  Devoria Albe, MD, Concha Pyo, MD 09/02/20 [phone]

## 2020-09-02 NOTE — ED Triage Notes (Signed)
Pt c/o body aches, cough and fatigue.

## 2020-09-22 ENCOUNTER — Other Ambulatory Visit: Payer: 59

## 2020-09-22 DIAGNOSIS — Z20822 Contact with and (suspected) exposure to covid-19: Secondary | ICD-10-CM

## 2020-09-23 LAB — NOVEL CORONAVIRUS, NAA: SARS-CoV-2, NAA: NOT DETECTED

## 2020-09-23 LAB — SARS-COV-2, NAA 2 DAY TAT

## 2020-10-10 ENCOUNTER — Other Ambulatory Visit: Payer: Self-pay | Admitting: Physician Assistant

## 2020-11-12 ENCOUNTER — Other Ambulatory Visit: Payer: Self-pay

## 2020-11-12 ENCOUNTER — Encounter (HOSPITAL_COMMUNITY): Payer: Self-pay

## 2020-11-12 ENCOUNTER — Emergency Department (HOSPITAL_COMMUNITY)
Admission: EM | Admit: 2020-11-12 | Discharge: 2020-11-12 | Disposition: A | Discharge status: Home / Self Care | Payer: 59 | Attending: Emergency Medicine | Admitting: Emergency Medicine

## 2020-11-12 DIAGNOSIS — E1165 Type 2 diabetes mellitus with hyperglycemia: Secondary | ICD-10-CM | POA: Diagnosis not present

## 2020-11-12 DIAGNOSIS — Z7984 Long term (current) use of oral hypoglycemic drugs: Secondary | ICD-10-CM | POA: Diagnosis not present

## 2020-11-12 DIAGNOSIS — K1370 Unspecified lesions of oral mucosa: Secondary | ICD-10-CM | POA: Insufficient documentation

## 2020-11-12 DIAGNOSIS — J029 Acute pharyngitis, unspecified: Secondary | ICD-10-CM | POA: Diagnosis not present

## 2020-11-12 DIAGNOSIS — Z760 Encounter for issue of repeat prescription: Secondary | ICD-10-CM | POA: Diagnosis not present

## 2020-11-12 DIAGNOSIS — Z7982 Long term (current) use of aspirin: Secondary | ICD-10-CM | POA: Diagnosis not present

## 2020-11-12 DIAGNOSIS — I10 Essential (primary) hypertension: Secondary | ICD-10-CM | POA: Insufficient documentation

## 2020-11-12 DIAGNOSIS — R739 Hyperglycemia, unspecified: Secondary | ICD-10-CM

## 2020-11-12 DIAGNOSIS — F17210 Nicotine dependence, cigarettes, uncomplicated: Secondary | ICD-10-CM | POA: Diagnosis not present

## 2020-11-12 DIAGNOSIS — B370 Candidal stomatitis: Secondary | ICD-10-CM

## 2020-11-12 HISTORY — DX: Sleep apnea, unspecified: G47.30

## 2020-11-12 HISTORY — DX: Essential (primary) hypertension: I10

## 2020-11-12 LAB — BASIC METABOLIC PANEL
Anion gap: 11 (ref 5–15)
BUN: 12 mg/dL (ref 6–20)
CO2: 27 mmol/L (ref 22–32)
Calcium: 9 mg/dL (ref 8.9–10.3)
Chloride: 97 mmol/L — ABNORMAL LOW (ref 98–111)
Creatinine, Ser: 1.02 mg/dL (ref 0.61–1.24)
GFR, Estimated: >60 mL/min (ref >60)
Glucose, Bld: 432 mg/dL — ABNORMAL HIGH (ref 70–99)
Potassium: 3.9 mmol/L (ref 3.5–5.1)
Sodium: 135 mmol/L (ref 135–145)

## 2020-11-12 LAB — CBC WITH DIFFERENTIAL/PLATELET
Abs Immature Granulocytes: 0.08 10*3/uL — ABNORMAL HIGH (ref 0.00–0.07)
Basophils Absolute: 0.1 10*3/uL (ref 0.0–0.1)
Basophils Relative: 1 %
Eosinophils Absolute: 0.1 10*3/uL (ref 0.0–0.5)
Eosinophils Relative: 1 %
HCT: 45.8 % (ref 39.0–52.0)
Hemoglobin: 15.6 g/dL (ref 13.0–17.0)
Immature Granulocytes: 1 %
Lymphocytes Relative: 32 %
Lymphs Abs: 3.1 10*3/uL (ref 0.7–4.0)
MCH: 31.1 pg (ref 26.0–34.0)
MCHC: 34.1 g/dL (ref 30.0–36.0)
MCV: 91.4 fL (ref 80.0–100.0)
Monocytes Absolute: 0.5 10*3/uL (ref 0.1–1.0)
Monocytes Relative: 6 %
Neutro Abs: 5.8 10*3/uL (ref 1.7–7.7)
Neutrophils Relative %: 59 %
Platelets: 211 10*3/uL (ref 150–400)
RBC: 5.01 MIL/uL (ref 4.22–5.81)
RDW: 12.9 % (ref 11.5–15.5)
WBC: 9.7 10*3/uL (ref 4.0–10.5)
nRBC: 0 % (ref 0.0–0.2)

## 2020-11-12 MED ORDER — NYSTATIN 100000 UNIT/ML MT SUSP: 500000.0000 [IU] | Freq: Four times a day (QID) | OROMUCOSAL | 0 refills | Status: DC

## 2020-11-12 MED ORDER — GABAPENTIN 300 MG PO CAPS: 300.0000 mg | ORAL_CAPSULE | Freq: Two times a day (BID) | ORAL | 0 refills | Status: AC

## 2020-11-12 MED ORDER — TRAZODONE HCL 50 MG PO TABS: 50.0000 mg | ORAL_TABLET | Freq: Every day | ORAL | 0 refills | Status: DC

## 2020-11-12 MED ORDER — SITAGLIPTIN PHOSPHATE 100 MG PO TABS: 100.0000 mg | ORAL_TABLET | Freq: Every day | ORAL | 0 refills | Status: DC

## 2020-11-12 MED ORDER — METFORMIN HCL 500 MG PO TABS: 500.0000 mg | ORAL_TABLET | Freq: Two times a day (BID) | ORAL | 0 refills | Status: DC

## 2020-11-12 MED ORDER — METFORMIN HCL 500 MG PO TABS
500.0000 mg | ORAL_TABLET | Freq: Once | ORAL | Status: AC
  Administered 2020-11-12: 500 mg via ORAL
  Filled 2020-11-12: qty 1

## 2020-11-12 NOTE — ED Triage Notes (Signed)
Pt to er, pt states that he ran out of his DM medications, states that since then he has been having some sores in his mouth and his tongue has some white stuff on it, pt states that he has had thrush before and this feels similar, pt talking in full sentences, denies sob or problems breathing.  States that he does have a sore throat

## 2020-11-12 NOTE — Discharge Instructions (Addendum)
Your blood sugar today was 432.  It is important that you take your diabetes medications daily as directed.  Please get your prescriptions filled.  Follow-up with your primary care provider for recheck.

## 2020-11-14 NOTE — ED Provider Notes (Signed)
[facility] EMERGENCY DEPARTMENT Provider Note   CSN: [id number] Arrival date & time: 11/12/20  1612     History Chief Complaint  Patient presents with  . Mouth Lesions    Don Owens is a 45 y.o. male.  HPI     Don Owens is a 45 y.o. male with history of type 2 diabetes who presents to the Emergency Department complaining of painful, white lesions in his mouth and on his tongue.  Mild sore throat.  Symptoms began several days ago when he ran out of his diabetes medications.  States that he had similar symptoms previously.  Ran out of his medications 2 weeks ago. He denies abdominal pain, vomiting, polyuria, polydipsia, shortness of breath and chest pain.    Past Medical History:  Diagnosis Date  . Diabetes mellitus    dx age 54  . Hypertension   . PTSD (post-traumatic stress disorder)   . Sleep apnea     Patient Active Problem List   Diagnosis Date Noted  . Excessive anger 02/24/2012  . Head injury, closed, with LOC of unknown duration (HCC) 02/24/2012  . H/O head injury 02/24/2012    History reviewed. No pertinent surgical history.     Family History  Problem Relation Age of Onset  . Heart disease Mother   . Diabetes Mother     Social History   Tobacco Use  . Smoking status: Current Every Day Smoker    Packs/day: 0.75    Types: Cigarettes  . Smokeless tobacco: Never Used  Vaping Use  . Vaping Use: Former  Substance Use Topics  . Alcohol use: Yes    Comment: occ  . Drug use: Not Currently    Types: Cocaine, Marijuana    Home Medications Prior to Admission medications   Medication Sig Start Date End Date Taking? Authorizing Provider  gabapentin (NEURONTIN) 300 MG capsule Take 1 capsule (300 mg total) by mouth 2 (two) times daily. 11/12/20  Yes Vane Yapp, PA-C  metFORMIN (GLUCOPHAGE) 500 MG tablet Take 1 tablet (500 mg total) by mouth 2 (two) times daily with a meal. 11/12/20  Yes Dakhari Zuver, PA-C  nystatin (MYCOSTATIN) 100000 UNIT/ML  suspension Take 5 mLs (500,000 Units total) by mouth 4 (four) times daily. 11/12/20  Yes Alanie Syler, PA-C  sitaGLIPtin (JANUVIA) 100 MG tablet Take 1 tablet (100 mg total) by mouth daily. 11/12/20  Yes Erubiel Manasco, PA-C  traZODone (DESYREL) 50 MG tablet Take 1 tablet (50 mg total) by mouth at bedtime. 11/12/20  Yes Camie Hauss, PA-C  amoxicillin (AMOXIL) 500 MG capsule Take 1 capsule (500 mg total) by mouth 3 (three) times daily. 09/02/20   Devoria Albe, MD  aspirin EC 81 MG tablet Take 1 tablet (81 mg total) by mouth daily. 06/13/19   Jacquelin Hawking, PA-C  citalopram (CELEXA) 20 MG tablet Take 20 mg by mouth daily.    [provider]  Cyanocobalamin (VITAMIN B 12 PO) Take by mouth. Patient not taking: Reported on 04/08/2020    [provider]  famotidine (PEPCID AC) 10 MG chewable tablet Chew 1 tablet (10 mg total) by mouth 2 (two) times daily. For control of stomach acid secretion and helps GERD. 02/28/12   Mike Craze, MD  ibuprofen (ADVIL) 200 MG tablet Take 200 mg by mouth every 6 (six) hours as needed. Patient not taking: Reported on 04/08/2020    [provider]  loratadine (CLARITIN) 10 MG tablet Take 10 mg by mouth daily.  [provider]  metFORMIN (GLUCOPHAGE) 500 MG tablet Take 1 tablet (500 mg total) by mouth 2 (two) times daily with a meal. 04/08/20   Jacquelin Hawking, PA-C  Multiple Vitamin (MULTIVITAMIN) capsule Take 1 capsule by mouth daily. Patient not taking: Reported on 04/08/2020    [provider]  sitaGLIPtin (JANUVIA) 100 MG tablet Take 1 tablet (100 mg total) by mouth daily. 04/08/20   Jacquelin Hawking, PA-C  traZODone (DESYREL) 100 MG tablet Take 100 mg by mouth at bedtime.    [provider]    Allergies    Patient has no known allergies.  Review of Systems   Review of Systems  Constitutional: Negative for appetite change, fatigue and fever.  HENT: Positive for mouth sores and sore throat. Negative for  congestion, dental problem, facial swelling, rhinorrhea, trouble swallowing and voice change.   Respiratory: Negative for cough, shortness of breath and wheezing.   Cardiovascular: Negative for chest pain.  Gastrointestinal: Negative for abdominal pain, diarrhea, nausea and vomiting.  Genitourinary: Negative for decreased urine volume and dysuria.  Musculoskeletal: Negative for neck pain and neck stiffness.  Skin: Negative for rash.  Neurological: Negative for dizziness, syncope, weakness, numbness and headaches.  Hematological: Negative for adenopathy.    Physical Exam Updated Vital Signs BP (!) 128/93 (BP Location: Left Arm)   Pulse 97   Temp 98.2 F (36.8 C) (Oral)   Resp 16   Ht 5\' 11"  (1.803 m)   Wt 119.7 kg   SpO2 97%   BMI 36.82 kg/m   Physical Exam Vitals and nursing note reviewed.  Constitutional:      General: He is not in acute distress.    Appearance: Normal appearance. He is not ill-appearing or toxic-appearing.  HENT:     Mouth/Throat:     Mouth: Mucous membranes are moist.     Comments: Pin point erythematous ulcerations to the soft palate with white plaques to the tongue and buccal mucosa.  No tonsillar edema or exudates.  Uvula is midline and non edematous Cardiovascular:     Rate and Rhythm: Normal rate and regular rhythm.     Pulses: Normal pulses.  Pulmonary:     Effort: Pulmonary effort is normal. No respiratory distress.     Breath sounds: No stridor.  Abdominal:     Palpations: Abdomen is soft. There is no mass.     Tenderness: There is no abdominal tenderness.  Musculoskeletal:        General: Normal range of motion.     Cervical back: Normal range of motion.     Right lower leg: No edema.     Left lower leg: No edema.  Lymphadenopathy:     Cervical: No cervical adenopathy.  Skin:    General: Skin is warm.     Capillary Refill: Capillary refill takes less than 2 seconds.     Findings: No rash.  Neurological:     General: No focal deficit  present.     Mental Status: He is alert.     Sensory: No sensory deficit.     Motor: No weakness.     ED Results / Procedures / Treatments   Labs (all labs ordered are listed, but only abnormal results are displayed) Labs Reviewed  BASIC METABOLIC PANEL - Abnormal; Notable for the following components:      Result Value   Chloride 97 (*)    Glucose, Bld 432 (*)    All other components within normal limits  CBC  WITH DIFFERENTIAL/PLATELET - Abnormal; Notable for the following components:   Abs Immature Granulocytes 0.08 (*)    All other components within normal limits    EKG None  Radiology No results found.  Procedures Procedures   Medications Ordered in ED Medications  metFORMIN (GLUCOPHAGE) tablet 500 mg (500 mg Oral Given 11/12/20 2018)    ED Course  I have reviewed the triage vital signs and the nursing notes.  Pertinent labs & imaging results that were available during my care of the patient were reviewed by me and considered in my medical decision making (see chart for details).    MDM Rules/Calculators/A&P                          Pt here with oral lesions and out of his diabetes medications. No systemic symptoms.  Well appearing.  Vitals reassuring. No DKA/HHS. Blood sugar 432, nml anion gap and bicarb.  No leukocytosis.    Given dose of metformin here.  Airway patent.  No concerning sx's for strep or PTA.  Likely thrush.  Will provide refills of his medications and mouthwash.  Agrees to close f/u   Final Clinical Impression(s) / ED Diagnoses Final diagnoses:  Thrush, oral  Hyperglycemia  Medication refill    Rx / DC Orders ED Discharge Orders         Ordered    nystatin (MYCOSTATIN) 100000 UNIT/ML suspension  4 times daily        11/12/20 1935    metFORMIN (GLUCOPHAGE) 500 MG tablet  2 times daily with meals        11/12/20 1935    gabapentin (NEURONTIN) 300 MG capsule  2 times daily        11/12/20 1935    sitaGLIPtin (JANUVIA) 100 MG tablet   Daily        11/12/20 1935    traZODone (DESYREL) 50 MG tablet  Daily at bedtime        11/12/20 1935           Pauline Aus, Cordelia Poche 11/14/20 2239    Mancel Bale, MD 11/15/20 1627

## 2021-01-15 ENCOUNTER — Encounter (HOSPITAL_COMMUNITY): Payer: Self-pay

## 2021-05-20 IMAGING — CT CT HEAD W/O CM
3 series · 16 of 47 positions shown, 19 images · non-contrast
Comparison: None.

CLINICAL DATA: Numbness and paresthesia

EXAM:
CT HEAD WITHOUT CONTRAST
TECHNIQUE: Contiguous axial images were obtained from the base of the skull
through the vertex without intravenous contrast.

[Series 2: head w o · axial · 0.45mm/px · z∈[+1202,+1337]mm · 10 of 33 slices shown, 13 images]
[im 3/33  brain]
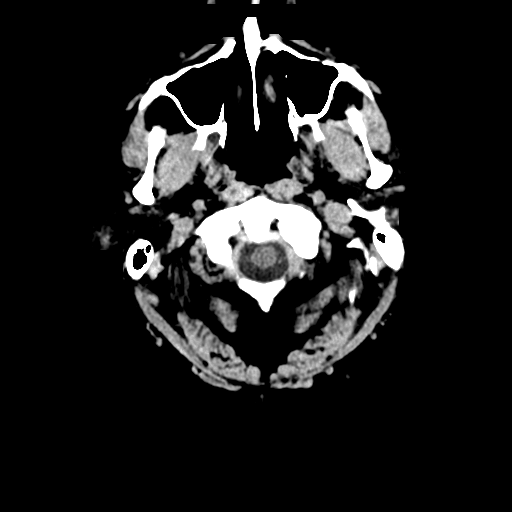
[im 3/33  bone]
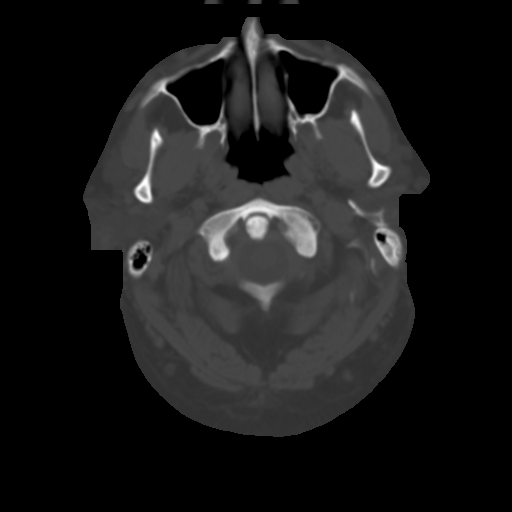
[im 6/33  brain]
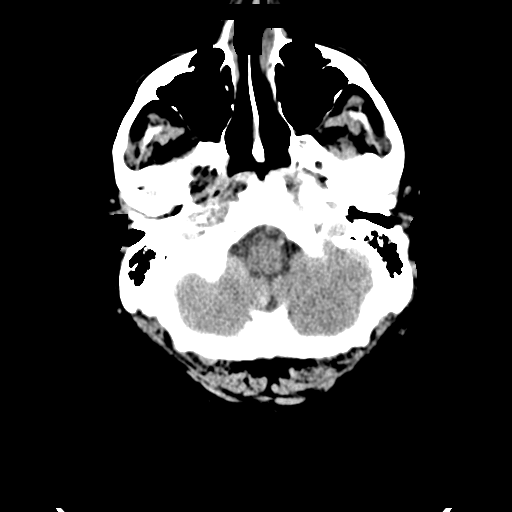
[im 9/33  brain]
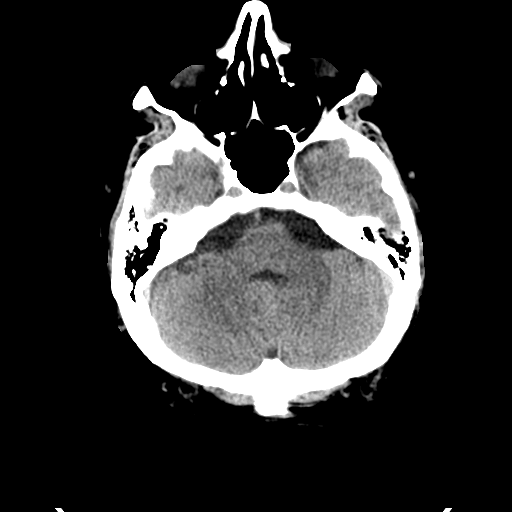
[im 12/33  brain]
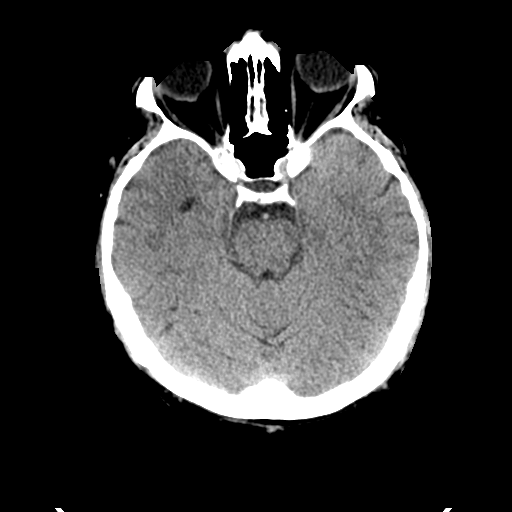
[im 15/33  brain]
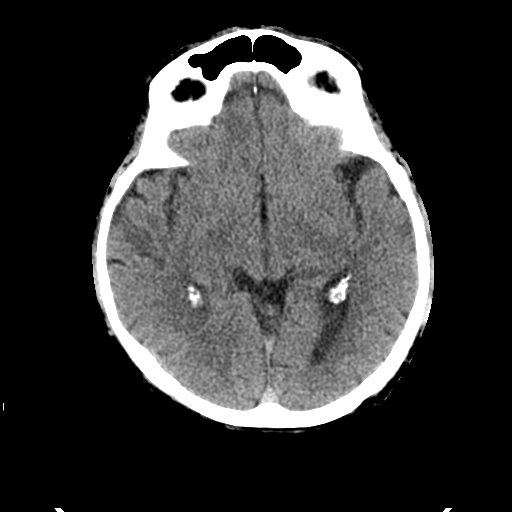
[im 15/33  bone]
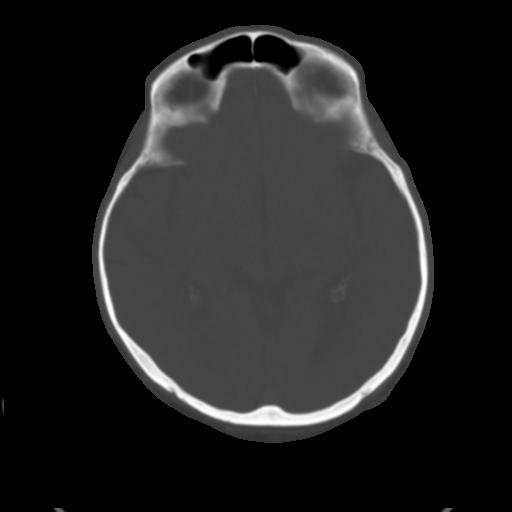
[im 18/33  brain]
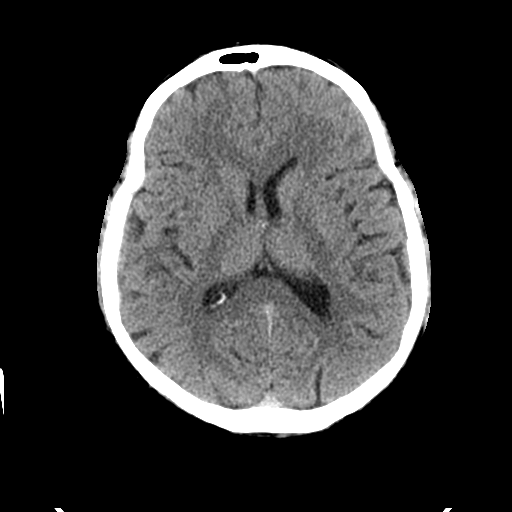
[im 21/33  brain]
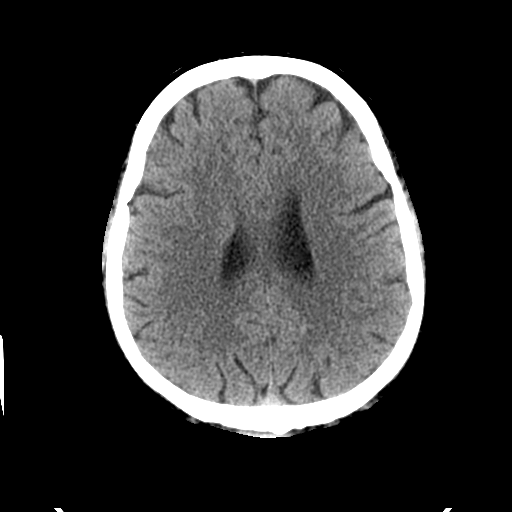
[im 25/33  brain]
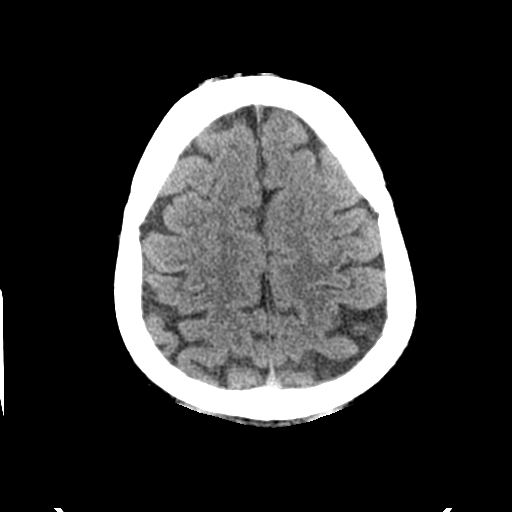
[im 27/33  brain]
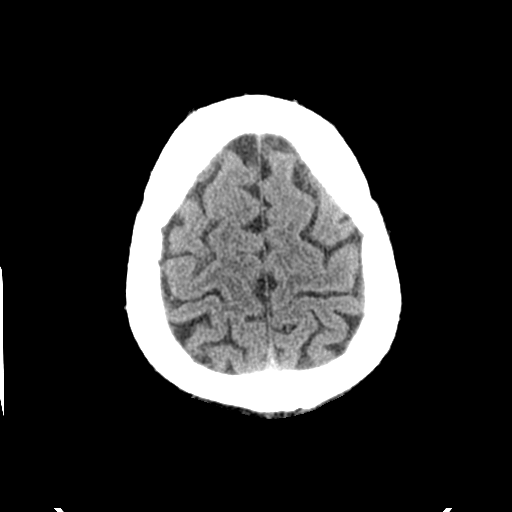
[im 27/33  bone]
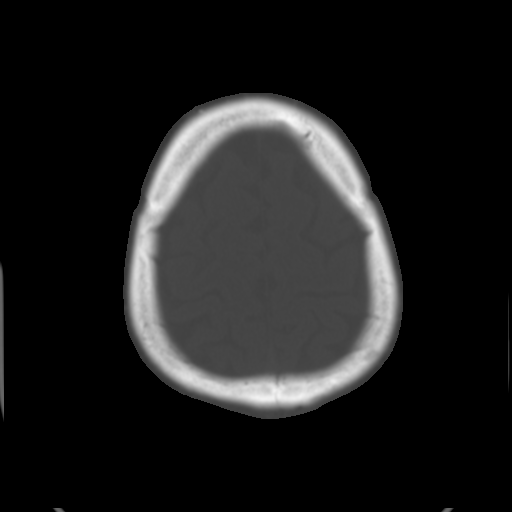
[im 30/33  brain]
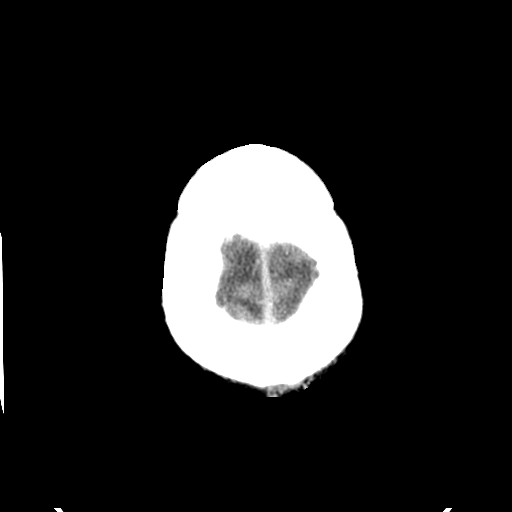

[Series 4: coronal soft · coronal · 0.38mm/px · 3 of 77 slices shown]
[im 26/77  brain]
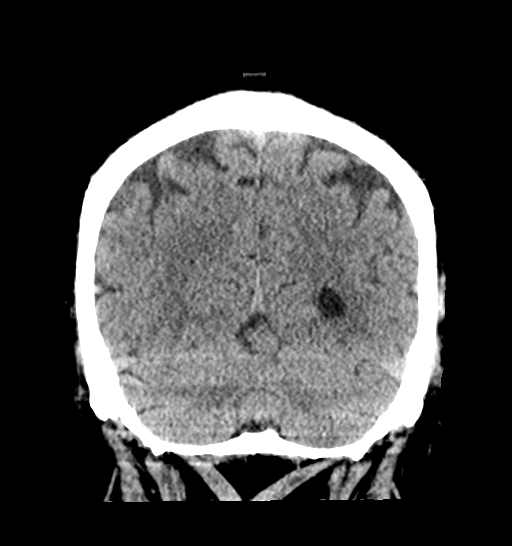
[im 34/77  brain]
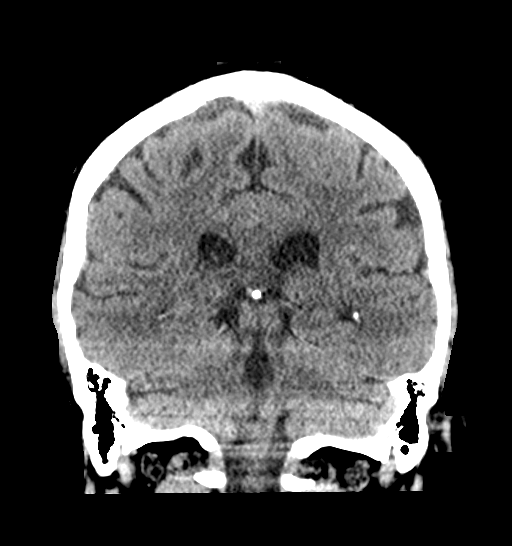
[im 43/77  brain]
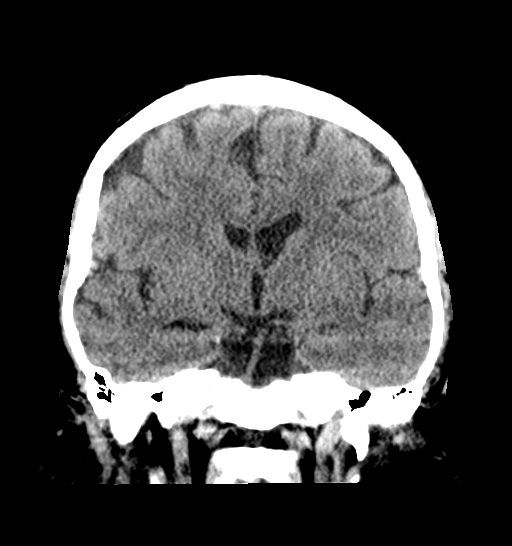

[Series 5: sagittal soft · sagittal · 0.38mm/px · 3 of 63 slices shown]
[im 21/63  brain]
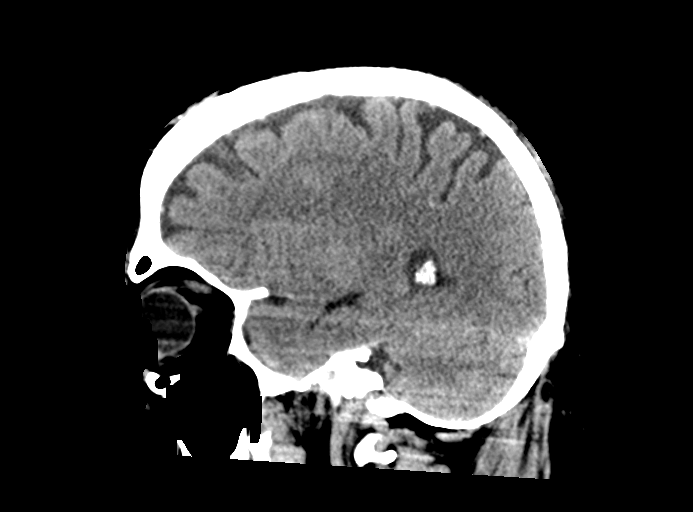
[im 32/63  brain]
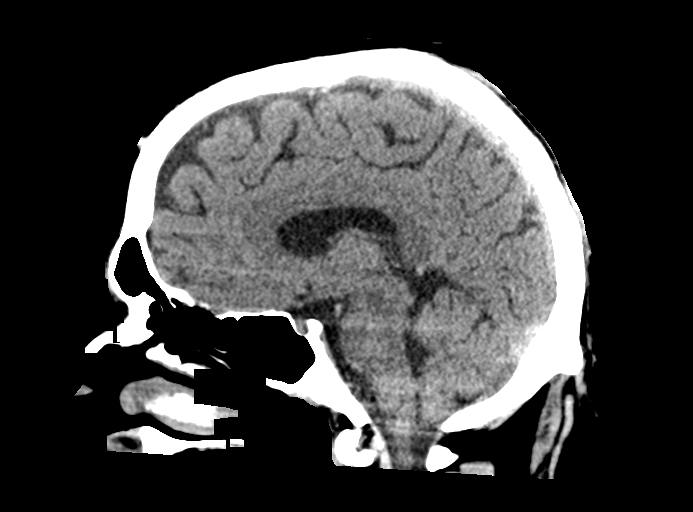
[im 42/63  brain]
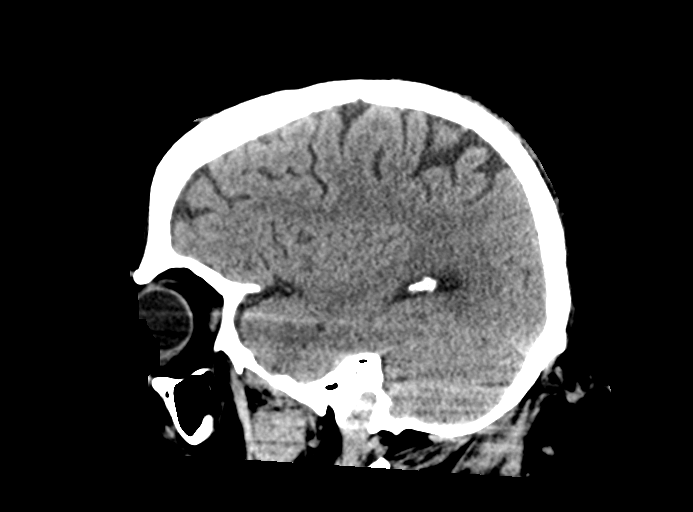

[16 of 47 positions shown; findings below may reference images not displayed]

FINDINGS: Brain: No evidence of acute infarction, hemorrhage, hydrocephalus,
extra-axial collection or mass lesion/mass effect.

Vascular: No hyperdense vessel or unexpected calcification.

Skull: Normal. Negative for fracture or focal lesion.

Sinuses/Orbits: No acute finding.

Other: None
IMPRESSION: Negative non contrasted CT appearance of the brain

## 2021-06-08 ENCOUNTER — Ambulatory Visit (INDEPENDENT_AMBULATORY_CARE_PROVIDER_SITE_OTHER): Payer: 59 | Admitting: Urology

## 2021-06-08 ENCOUNTER — Telehealth: Payer: Self-pay

## 2021-06-08 ENCOUNTER — Encounter: Payer: Self-pay | Admitting: Urology

## 2021-06-08 ENCOUNTER — Other Ambulatory Visit: Payer: Self-pay

## 2021-06-08 VITALS — BP 129/78 | HR 97

## 2021-06-08 DIAGNOSIS — N5201 Erectile dysfunction due to arterial insufficiency: Secondary | ICD-10-CM

## 2021-06-08 DIAGNOSIS — R3912 Poor urinary stream: Secondary | ICD-10-CM | POA: Diagnosis not present

## 2021-06-08 DIAGNOSIS — N486 Induration penis plastica: Secondary | ICD-10-CM | POA: Insufficient documentation

## 2021-06-08 MED ORDER — TAMSULOSIN HCL 0.4 MG PO CAPS: 0.4000 mg | ORAL_CAPSULE | Freq: Every day | ORAL | 11 refills | Status: DC

## 2021-06-08 NOTE — Patient Instructions (Signed)
Erectile Dysfunction °Erectile dysfunction (ED) is the inability to get or keep an erection in order to have sexual intercourse. ED is considered a symptom of an underlying disorder and is not considered a disease. ED may include: °Inability to get an erection. °Lack of enough hardness of the erection to allow penetration. °Loss of erection before sex is finished. °What are the causes? °This condition may be caused by: °Physical causes, such as: °Artery problems. This may include heart disease, high blood pressure, atherosclerosis, and diabetes. °Hormonal problems, such as low testosterone. °Obesity. °Nerve problems. This may include back or pelvic injuries, multiple sclerosis, Parkinson's disease, spinal cord injury, and stroke. °Certain medicines, such as: °Pain relievers. °Antidepressants. °Blood pressure medicines and water pills (diuretics). °Cancer medicines. °Antihistamines. °Muscle relaxants. °Lifestyle factors, such as: °Use of drugs such as marijuana, cocaine, or opioids. °Excessive use of alcohol. °Smoking. °Lack of physical activity or exercise. °Psychological causes, such as: °Anxiety or stress. °Sadness or depression. °Exhaustion. °Fear about sexual performance. °Guilt. °What are the signs or symptoms? °Symptoms of this condition include: °Inability to get an erection. °Lack of enough hardness of the erection to allow penetration. °Loss of the erection before sex is finished. °Sometimes having normal erections, but with frequent unsatisfactory episodes. °Low sexual satisfaction in either partner due to erection problems. °A curved penis occurring with erection. The curve may cause pain, or the penis may be too curved to allow for intercourse. °Never having nighttime or morning erections. °How is this diagnosed? °This condition is often diagnosed by: °Performing a physical exam to find other diseases or specific problems with the penis. °Asking you detailed questions about the problem. °Doing tests,  such as: °Blood tests to check for diabetes mellitus or high cholesterol, or to measure hormone levels. °Other tests to check for underlying health conditions. °An ultrasound exam to check for scarring. °A test to check blood flow to the penis. °Doing a sleep study at home to measure nighttime erections. °How is this treated? °This condition may be treated by: °Medicines, such as: °Medicine taken by mouth to help you achieve an erection (oral medicine). °Hormone replacement therapy to replace low testosterone levels. °Medicine that is injected into the penis. Your health care provider may instruct you how to give yourself these injections at home. °Medicine that is delivered with a short applicator tube. The tube is inserted into the opening at the tip of the penis, which is the opening of the urethra. A tiny pellet of medicine is put in the urethra. The pellet dissolves and enhances erectile function. This is also called MUSE (medicated urethral system for erections) therapy. °Vacuum pump. This is a pump with a ring on it. The pump and ring are placed on the penis and used to create pressure that helps the penis become erect. °Penile implant surgery. In this procedure, you may receive: °An inflatable implant. This consists of cylinders, a pump, and a reservoir. The cylinders can be inflated with a fluid that helps to create an erection, and they can be deflated after intercourse. °A semi-rigid implant. This consists of two silicone rubber rods. The rods provide some rigidity. They are also flexible, so the penis can both curve downward in its normal position and become straight for sexual intercourse. °Blood vessel surgery to improve blood flow to the penis. During this procedure, a blood vessel from a different part of the body is placed into the penis to allow blood to flow around (bypass) damaged or blocked blood vessels. °Lifestyle changes,   such as exercising more, losing weight, and quitting smoking. °Follow  these instructions at home: °Medicines ° °Take over-the-counter and prescription medicines only as told by your health care provider. Do not increase the dosage without first discussing it with your health care provider. °If you are using self-injections, do injections as directed by your health care provider. Make sure you avoid any veins that are on the surface of the penis. After giving an injection, apply pressure to the injection site for 5 minutes. °Talk to your health care provider about how to prevent headaches while taking ED medicines. These medicines may cause a sudden headache due to the increase in blood flow in your body. °General instructions °Exercise regularly, as directed by your health care provider. Work with your health care provider to lose weight, if needed. °Do not use any products that contain nicotine or tobacco. These products include cigarettes, chewing tobacco, and vaping devices, such as e-cigarettes. If you need help quitting, ask your health care provider. °Before using a vacuum pump, read the instructions that come with the pump and discuss any questions with your health care provider. °Keep all follow-up visits. This is important. °Contact a health care provider if: °You feel nauseous. °You are vomiting. °You get sudden headaches while taking ED medicines. °You have any concerns about your sexual health. °Get help right away if: °You are taking oral or injectable medicines and you have an erection that lasts longer than 4 hours. If your health care provider is unavailable, go to the nearest emergency room for evaluation. An erection that lasts much longer than 4 hours can result in permanent damage to your penis. °You have severe pain in your groin or abdomen. °You develop redness or severe swelling of your penis. °You have redness spreading at your groin or lower abdomen. °You are unable to urinate. °You experience chest pain or a rapid heartbeat (palpitations) after taking oral  medicines. °These symptoms may represent a serious problem that is an emergency. Do not wait to see if the symptoms will go away. Get medical help right away. Call your local emergency services (911 in the [place] Do not drive yourself to the hospital. °Summary °Erectile dysfunction (ED) is the inability to get or keep an erection during sexual intercourse. °This condition is diagnosed based on a physical exam, your symptoms, and tests to determine the cause. Treatment varies depending on the cause and may include medicines, hormone therapy, surgery, or a vacuum pump. °You may need follow-up visits to make sure that you are using your medicines or devices correctly. °Get help right away if you are taking or injecting medicines and you have an erection that lasts longer than 4 hours. °This information is not intended to replace advice given to you by your health care provider. Make sure you discuss any questions you have with your health care provider. °Document Revised: 11/26/2020 Document Reviewed: 11/26/2020 °Elsevier Patient Education © 2022 [employer] ° °

## 2021-06-08 NOTE — Telephone Encounter (Signed)
The referral came from urology, I have closed the referral

## 2021-06-08 NOTE — Progress Notes (Signed)
Urological Symptom Review  Patient is experiencing the following symptoms: Frequent urination Hard to postpone urination Get up at night to urinate Leakage of urine Trouble starting stream Painful intercourse Weak stream   Review of Systems  Gastrointestinal (upper)  : Indigestion/heartburn  Gastrointestinal (lower) : Negative for lower GI symptoms  Constitutional : Fatigue  Skin: Negative for skin symptoms  Eyes: Blurred vision  Ear/Nose/Throat : Sinus problems  Hematologic/Lymphatic: Easy bruising  Cardiovascular : Leg swelling Chest pain  Respiratory : Shortness of breath  Endocrine: Excessive thirst  Musculoskeletal: Back pain Joint pain  Neurological: Dizziness  Psychologic: Depression Anxiety

## 2021-06-08 NOTE — Progress Notes (Signed)
06/08/2021 2:59 PM   Don Owens Jan 02, 1976 [id number]  Referring provider: Waldon Reining, MD [address] [place],  [place] [address]  Peyronies disease   HPI: Don Owens is a 44yo here for evaluation for peyronies disease, weakl urinary stream and erectile dysfunction. For the past 2 years he noted ventral curvature with erections. He had pain with his erections and he noted a knot 2 years ago. The curvature has not changed in 18 months. He has intermittent pain with erections. He has difficulty getting and maintaining his erection. He has a hx of MI at 59. He gets short of breath with intercourse. He has DMII and his last A1c was 13 in July 2022. He also has urinary urgency, urinary frequency, nocturia and a weak urinary stream. No prior BPH therapy., We has an intermittent weak stream.    PMH: Past Medical History:  Diagnosis Date   Diabetes mellitus    dx age 72   Hypertension    PTSD (post-traumatic stress disorder)    Sleep apnea     Surgical History: No past surgical history on file.  Home Medications:  Allergies as of 06/08/2021   No Known Allergies      Medication List        Accurate as of June 08, 2021  2:59 PM. If you have any questions, ask your nurse or doctor.          amitriptyline 50 MG tablet Commonly known as: ELAVIL Take 50 mg by mouth at bedtime.   amoxicillin 500 MG capsule Commonly known as: AMOXIL Take 1 capsule (500 mg total) by mouth 3 (three) times daily.   aspirin EC 81 MG tablet Take 1 tablet (81 mg total) by mouth daily.   buPROPion 200 MG 12 hr tablet Commonly known as: WELLBUTRIN SR Take 200 mg by mouth 2 (two) times daily.   citalopram 20 MG tablet Commonly known as: CELEXA Take 20 mg by mouth daily.   famotidine 10 MG chewable tablet Commonly known as: PEPCID AC Chew 1 tablet (10 mg total) by mouth 2 (two) times daily. For control of stomach acid secretion and helps GERD.   famotidine 20 MG  tablet Commonly known as: PEPCID Take 20 mg by mouth at bedtime as needed.   gabapentin 300 MG capsule Commonly known as: Neurontin Take 1 capsule (300 mg total) by mouth 2 (two) times daily.   ibuprofen 200 MG tablet Commonly known as: ADVIL Take 200 mg by mouth every 6 (six) hours as needed.   loratadine 10 MG tablet Commonly known as: CLARITIN Take 10 mg by mouth daily.   metFORMIN 500 MG tablet Commonly known as: GLUCOPHAGE Take 1 tablet (500 mg total) by mouth 2 (two) times daily with a meal.   metFORMIN 500 MG tablet Commonly known as: GLUCOPHAGE Take 1 tablet (500 mg total) by mouth 2 (two) times daily with a meal.   metFORMIN 850 MG tablet Commonly known as: GLUCOPHAGE Take 850 mg by mouth 2 (two) times daily.   multivitamin capsule Take 1 capsule by mouth daily.   nystatin 100000 UNIT/ML suspension Commonly known as: MYCOSTATIN Take 5 mLs (500,000 Units total) by mouth 4 (four) times daily.   sitaGLIPtin 100 MG tablet Commonly known as: Januvia Take 1 tablet (100 mg total) by mouth daily.   sitaGLIPtin 100 MG tablet Commonly known as: Januvia Take 1 tablet (100 mg total) by mouth daily.   traZODone 100 MG tablet Commonly known as: DESYREL  Take 100 mg by mouth at bedtime.   traZODone 50 MG tablet Commonly known as: DESYREL Take 1 tablet (50 mg total) by mouth at bedtime.   VITAMIN B 12 PO Take by mouth.        Allergies: No Known Allergies  Family History: Family History  Problem Relation Age of Onset   Heart disease Mother    Diabetes Mother     Social History:  reports that he has been smoking cigarettes. He has been smoking an average of .75 packs per day. He has never used smokeless tobacco. He reports current alcohol use. He reports that he does not currently use drugs after having used the following drugs: Cocaine and Marijuana.  ROS: All other review of systems were reviewed and are negative except what is noted above in  HPI  Physical Exam: BP 129/78   Pulse 97   Constitutional:  Alert and oriented, No acute distress. HEENT: [place] AT, moist mucus membranes.  Trachea midline, no masses. Cardiovascular: No clubbing, cyanosis, or edema. Respiratory: Normal respiratory effort, no increased work of breathing. GI: Abdomen is soft, nontender, nondistended, no abdominal masses GU: No CVA tenderness. Circumcised phallus. No masses/lesions on penis, testis, scrotum. 3cm ventral and dorsal palpable peyronies plaque. Lymph: No cervical or inguinal lymphadenopathy. Skin: No rashes, bruises or suspicious lesions. Neurologic: Grossly intact, no focal deficits, moving all 4 extremities. Psychiatric: Normal mood and affect.  Laboratory Data: Lab Results  Component Value Date   WBC 9.7 11/12/2020   HGB 15.6 11/12/2020   HCT 45.8 11/12/2020   MCV 91.4 11/12/2020   PLT 211 11/12/2020    Lab Results  Component Value Date   CREATININE 1.02 11/12/2020    No results found for: PSA  No results found for: TESTOSTERONE  Lab Results  Component Value Date   HGBA1C 7.2 (H) 01/08/2020    Urinalysis No results found for: COLORURINE, APPEARANCEUR, LABSPEC, PHURINE, GLUCOSEU, HGBUR, BILIRUBINUR, KETONESUR, PROTEINUR, UROBILINOGEN, NITRITE, LEUKOCYTESUR  No results found for: LABMICR, WBCUA, RBCUA, LABEPIT, MUCUS, BACTERIA  Pertinent Imaging:  No results found for this or any previous visit.  No results found for this or any previous visit.  No results found for this or any previous visit.  No results found for this or any previous visit.  No results found for this or any previous visit.  No results found for this or any previous visit.  No results found for this or any previous visit.  No results found for this or any previous visit.   Assessment & Plan:    1. Peyronie disease We discussed the management of peyronies disease including medical therapy, penile plication, verapamil therapy and xiaflex  therapy. Due to his ventral curvature he is only a candidate for penile plication. His poorly controlled Diabetes does not make him a surgical candidate  2. Erectile dysfunction due to arterial insufficiency -we will defer therapy at this time until he sees cardiology and endocrinology.    3. Weak urinary stream -we will trial flomax 0.4mg  daily   No follow-ups on file.  Wilkie Aye, MD  [facility] [place]

## 2021-06-08 NOTE — Telephone Encounter (Signed)
Dr Fransico Him- Received a referral on pt for N52.01 (ICD-10-CM) - Erectile dysfunction due to arterial insufficiency. Do you want to see this patient?  Thanks.

## 2021-07-15 NOTE — Telephone Encounter (Signed)
Received another referral on pt for DM and A1C of 12.1.

## 2021-07-16 ENCOUNTER — Ambulatory Visit (INDEPENDENT_AMBULATORY_CARE_PROVIDER_SITE_OTHER): Payer: 59 | Admitting: Cardiology

## 2021-07-16 ENCOUNTER — Encounter: Payer: Self-pay | Admitting: *Deleted

## 2021-07-16 ENCOUNTER — Encounter: Payer: Self-pay | Admitting: Cardiology

## 2021-07-16 VITALS — BP 132/90 | HR 105 | Ht 71.0 in | Wt 257.0 lb

## 2021-07-16 DIAGNOSIS — R079 Chest pain, unspecified: Secondary | ICD-10-CM

## 2021-07-16 DIAGNOSIS — Z01812 Encounter for preprocedural laboratory examination: Secondary | ICD-10-CM

## 2021-07-16 MED ORDER — METOPROLOL TARTRATE 100 MG PO TABS: 100.0000 mg | ORAL_TABLET | Freq: Once | ORAL | 0 refills | Status: DC

## 2021-07-16 NOTE — Progress Notes (Signed)
Clinical Summary Mr. Landfair is a 45 y.o.male seen today as a new consult for the following medical problems.    1.Chest pain -age 46 episode of chest pain, taken to [facility] - he was told he had "heart attack due to stress". Does not recall the testing that was done   - recent chest pains at times with activity. Occurs few times a week. Can be a pressure, sharp, or dull. Midchest, 6-7/10 in severity. Can have some SOB. Pain lasts a few seconds.  - symptoms started 2.5 years ago, some increased frequency  CAD risk factors: + DM2, hyperlipidemia, +smoker x 30 years, mother had "heart issues" not sure of details.        Past Medical History:  Diagnosis Date   Diabetes mellitus    dx age 66   Hypertension    PTSD (post-traumatic stress disorder)    Sleep apnea      No Known Allergies   Current Outpatient Medications  Medication Sig Dispense Refill   amitriptyline (ELAVIL) 50 MG tablet Take 50 mg by mouth at bedtime.     amoxicillin (AMOXIL) 500 MG capsule Take 1 capsule (500 mg total) by mouth 3 (three) times daily. 30 capsule 0   aspirin EC 81 MG tablet Take 1 tablet (81 mg total) by mouth daily.     buPROPion (WELLBUTRIN SR) 200 MG 12 hr tablet Take 200 mg by mouth 2 (two) times daily.     citalopram (CELEXA) 20 MG tablet Take 20 mg by mouth daily.     Cyanocobalamin (VITAMIN B 12 PO) Take by mouth.     famotidine (PEPCID AC) 10 MG chewable tablet Chew 1 tablet (10 mg total) by mouth 2 (two) times daily. For control of stomach acid secretion and helps GERD. 60 tablet 0   famotidine (PEPCID) 20 MG tablet Take 20 mg by mouth at bedtime as needed.     gabapentin (NEURONTIN) 300 MG capsule Take 1 capsule (300 mg total) by mouth 2 (two) times daily. 30 capsule 0   ibuprofen (ADVIL) 200 MG tablet Take 200 mg by mouth every 6 (six) hours as needed.     loratadine (CLARITIN) 10 MG tablet Take 10 mg by mouth daily.     metFORMIN (GLUCOPHAGE) 500 MG tablet Take 1  tablet (500 mg total) by mouth 2 (two) times daily with a meal. 180 tablet 1   metFORMIN (GLUCOPHAGE) 500 MG tablet Take 1 tablet (500 mg total) by mouth 2 (two) times daily with a meal. 60 tablet 0   metFORMIN (GLUCOPHAGE) 850 MG tablet Take 850 mg by mouth 2 (two) times daily.     Multiple Vitamin (MULTIVITAMIN) capsule Take 1 capsule by mouth daily.     nystatin (MYCOSTATIN) 100000 UNIT/ML suspension Take 5 mLs (500,000 Units total) by mouth 4 (four) times daily. 120 mL 0   sitaGLIPtin (JANUVIA) 100 MG tablet Take 1 tablet (100 mg total) by mouth daily. 90 tablet 1   sitaGLIPtin (JANUVIA) 100 MG tablet Take 1 tablet (100 mg total) by mouth daily. 30 tablet 0   tamsulosin (FLOMAX) 0.4 MG CAPS capsule Take 1 capsule (0.4 mg total) by mouth daily after supper. 30 capsule 11   traZODone (DESYREL) 100 MG tablet Take 100 mg by mouth at bedtime.     traZODone (DESYREL) 50 MG tablet Take 1 tablet (50 mg total) by mouth at bedtime. 15 tablet 0   No current facility-administered medications for this visit.  No past surgical history on file.   No Known Allergies    Family History  Problem Relation Age of Onset   Heart disease Mother    Diabetes Mother      Social History Mr. Aust reports that he has been smoking cigarettes. He has been smoking an average of .75 packs per day. He has never used smokeless tobacco. Mr. Coble reports current alcohol use.   Review of Systems CONSTITUTIONAL: No weight loss, fever, chills, weakness or fatigue.  HEENT: Eyes: No visual loss, blurred vision, double vision or yellow sclerae.No hearing loss, sneezing, congestion, runny nose or sore throat.  SKIN: No rash or itching.  CARDIOVASCULAR: per hpi RESPIRATORY: per hpi GASTROINTESTINAL: No anorexia, nausea, vomiting or diarrhea. No abdominal pain or blood.  GENITOURINARY: No burning on urination, no polyuria NEUROLOGICAL: No headache, dizziness, syncope, paralysis, ataxia, numbness or tingling in  the extremities. No change in bowel or bladder control.  MUSCULOSKELETAL: No muscle, back pain, joint pain or stiffness.  LYMPHATICS: No enlarged nodes. No history of splenectomy.  PSYCHIATRIC: No history of depression or anxiety.  ENDOCRINOLOGIC: No reports of sweating, cold or heat intolerance. No polyuria or polydipsia.  Marland Kitchen   Physical Examination Today's Vitals   07/16/21 1056  BP: 132/90  Pulse: (!) 105  SpO2: 96%  Weight: 257 lb (116.6 kg)  Height: 5\' 11"  (1.803 m)   Body mass index is 35.84 kg/m.  Gen: resting comfortably, no acute distress HEENT: no scleral icterus, pupils equal round and reactive, no palptable cervical adenopathy,  CV: RRR, no m/r/g no jvd Resp: Clear to auscultation bilaterally GI: abdomen is soft, non-tender, non-distended, normal bowel sounds, no hepatosplenomegaly MSK: extremities are warm, no edema.  Skin: warm, no rash Neuro:  no focal deficits Psych: appropriate affect      Assessment and Plan  Chest pain - unclear prior cardiac history. He reports an admission at age 78 at [facility] where he was told he had a "heart attack due to stress", does not recall testing that was done. I am not sure what this means exactly, but requesting records.  - recent exertinoal chest pains. Long smoking history with poorly controlled DM2. Not able to run on treadmill due to neuropathy, patient prefers to avoid nuclear tracer, plan for coronary CTA - EKG today shows SR, RAD      [facility], M.D

## 2021-07-16 NOTE — Patient Instructions (Addendum)
Medication Instructions:  Continue all current medications.  Labwork: BMET - order given today.  Please do just prior to CT.  Testing/Procedures: Coronary CT Office will contact with results via phone or letter.    Follow-Up: Pending test results   Any Other Special Instructions Will Be Listed Below (If Applicable).   If you need a refill on your cardiac medications before your next appointment, please call your pharmacy.

## 2021-07-22 ENCOUNTER — Ambulatory Visit: Payer: 59 | Admitting: Urology

## 2021-07-28 ENCOUNTER — Ambulatory Visit (INDEPENDENT_AMBULATORY_CARE_PROVIDER_SITE_OTHER): Payer: 59 | Admitting: Urology

## 2021-07-28 ENCOUNTER — Encounter (HOSPITAL_COMMUNITY): Payer: Self-pay

## 2021-07-28 ENCOUNTER — Encounter: Payer: Self-pay | Admitting: Urology

## 2021-07-28 ENCOUNTER — Other Ambulatory Visit: Payer: Self-pay

## 2021-07-28 DIAGNOSIS — R3912 Poor urinary stream: Secondary | ICD-10-CM | POA: Diagnosis not present

## 2021-07-28 NOTE — Progress Notes (Signed)
07/28/2021 3:20 PM   Don Owens Jul 16, 1976 [id number]  Referring provider: Waldon Reining, MD [address] [place],  [place] [address]  Patient location: home Physician location: office I connected with  Don Owens on 07/28/21 by a video enabled telemedicine application and verified that I am speaking with the correct person using two identifiers.   I discussed the limitations of evaluation and management by telemedicine. The patient expressed understanding and agreed to proceed.   Followup weak urinary stream and erectile dysfunction   HPI: Mr Heintzelman a 45ZH here for followup for a weak urinary stream and erectile dysfunction. He was started on flomax 0.4mg  daily last visit. Nocturia decreased to 1x and urine stream is now strong. Overall he is very happy with his progress on flomax. He has seen Dr. Wyline Mood with cardiology and is having a nuclear stress in the next month.    PMH: Past Medical History:  Diagnosis Date   Diabetes mellitus    dx age 45   Hypertension    PTSD (post-traumatic stress disorder)    Sleep apnea     Surgical History: No past surgical history on file.  Home Medications:  Allergies as of 07/28/2021   No Known Allergies      Medication List        Accurate as of July 28, 2021  3:20 PM. If you have any questions, ask your nurse or doctor.          amitriptyline 50 MG tablet Commonly known as: ELAVIL Take 50 mg by mouth at bedtime.   aspirin EC 81 MG tablet Take 1 tablet (81 mg total) by mouth daily.   buPROPion 200 MG 12 hr tablet Commonly known as: WELLBUTRIN SR Take 200 mg by mouth 2 (two) times daily.   citalopram 20 MG tablet Commonly known as: CELEXA Take 20 mg by mouth daily.   famotidine 20 MG tablet Commonly known as: PEPCID Take 20 mg by mouth at bedtime as needed.   gabapentin 300 MG capsule Commonly known as: Neurontin Take 1 capsule (300 mg total) by mouth 2 (two) times daily.   ibuprofen 200 MG  tablet Commonly known as: ADVIL Take 200 mg by mouth every 6 (six) hours as needed.   loratadine 10 MG tablet Commonly known as: CLARITIN Take 10 mg by mouth daily.   metFORMIN 1000 MG tablet Commonly known as: GLUCOPHAGE Take 1,000 mg by mouth 2 (two) times daily.   metoprolol tartrate 100 MG tablet Commonly known as: LOPRESSOR Take 1 tablet (100 mg total) by mouth once for 1 dose.   multivitamin capsule Take 1 capsule by mouth daily.   tamsulosin 0.4 MG Caps capsule Commonly known as: FLOMAX Take 1 capsule (0.4 mg total) by mouth daily after supper.   traZODone 100 MG tablet Commonly known as: DESYREL Take 100 mg by mouth at bedtime.        Allergies: No Known Allergies  Family History: Family History  Problem Relation Age of Onset   Heart disease Mother    Diabetes Mother     Social History:  reports that he has been smoking cigarettes. He has been smoking an average of 1 pack per day. He has never used smokeless tobacco. He reports current alcohol use. He reports that he does not currently use drugs after having used the following drugs: Cocaine and Marijuana.  ROS: All other review of systems were reviewed and are negative except what is noted above in HPI  Laboratory Data: Lab Results  Component Value Date   WBC 9.7 11/12/2020   HGB 15.6 11/12/2020   HCT 45.8 11/12/2020   MCV 91.4 11/12/2020   PLT 211 11/12/2020    Lab Results  Component Value Date   CREATININE 1.02 11/12/2020    No results found for: PSA  No results found for: TESTOSTERONE  Lab Results  Component Value Date   HGBA1C 7.2 (H) 01/08/2020    Urinalysis No results found for: COLORURINE, APPEARANCEUR, LABSPEC, PHURINE, GLUCOSEU, HGBUR, BILIRUBINUR, KETONESUR, PROTEINUR, UROBILINOGEN, NITRITE, LEUKOCYTESUR  No results found for: LABMICR, WBCUA, RBCUA, LABEPIT, MUCUS, BACTERIA  Pertinent Imaging:  No results found for this or any previous visit.  No results found for this  or any previous visit.  No results found for this or any previous visit.  No results found for this or any previous visit.  No results found for this or any previous visit.  No results found for this or any previous visit.  No results found for this or any previous visit.  No results found for this or any previous visit.   Assessment & Plan:    1. Weak urinary stream -continue flomax 0.4mg  daily  2. Erectile dysfunction -We will await cardiology recommendations prior to initiating therapy   No follow-ups on file.  Wilkie Aye, MD  [facility]

## 2021-07-28 NOTE — Patient Instructions (Signed)
Erectile Dysfunction °Erectile dysfunction (ED) is the inability to get or keep an erection in order to have sexual intercourse. ED is considered a symptom of an underlying disorder and is not considered a disease. ED may include: °Inability to get an erection. °Lack of enough hardness of the erection to allow penetration. °Loss of erection before sex is finished. °What are the causes? °This condition may be caused by: °Physical causes, such as: °Artery problems. This may include heart disease, high blood pressure, atherosclerosis, and diabetes. °Hormonal problems, such as low testosterone. °Obesity. °Nerve problems. This may include back or pelvic injuries, multiple sclerosis, Parkinson's disease, spinal cord injury, and stroke. °Certain medicines, such as: °Pain relievers. °Antidepressants. °Blood pressure medicines and water pills (diuretics). °Cancer medicines. °Antihistamines. °Muscle relaxants. °Lifestyle factors, such as: °Use of drugs such as marijuana, cocaine, or opioids. °Excessive use of alcohol. °Smoking. °Lack of physical activity or exercise. °Psychological causes, such as: °Anxiety or stress. °Sadness or depression. °Exhaustion. °Fear about sexual performance. °Guilt. °What are the signs or symptoms? °Symptoms of this condition include: °Inability to get an erection. °Lack of enough hardness of the erection to allow penetration. °Loss of the erection before sex is finished. °Sometimes having normal erections, but with frequent unsatisfactory episodes. °Low sexual satisfaction in either partner due to erection problems. °A curved penis occurring with erection. The curve may cause pain, or the penis may be too curved to allow for intercourse. °Never having nighttime or morning erections. °How is this diagnosed? °This condition is often diagnosed by: °Performing a physical exam to find other diseases or specific problems with the penis. °Asking you detailed questions about the problem. °Doing tests,  such as: °Blood tests to check for diabetes mellitus or high cholesterol, or to measure hormone levels. °Other tests to check for underlying health conditions. °An ultrasound exam to check for scarring. °A test to check blood flow to the penis. °Doing a sleep study at home to measure nighttime erections. °How is this treated? °This condition may be treated by: °Medicines, such as: °Medicine taken by mouth to help you achieve an erection (oral medicine). °Hormone replacement therapy to replace low testosterone levels. °Medicine that is injected into the penis. Your health care provider may instruct you how to give yourself these injections at home. °Medicine that is delivered with a short applicator tube. The tube is inserted into the opening at the tip of the penis, which is the opening of the urethra. A tiny pellet of medicine is put in the urethra. The pellet dissolves and enhances erectile function. This is also called MUSE (medicated urethral system for erections) therapy. °Vacuum pump. This is a pump with a ring on it. The pump and ring are placed on the penis and used to create pressure that helps the penis become erect. °Penile implant surgery. In this procedure, you may receive: °An inflatable implant. This consists of cylinders, a pump, and a reservoir. The cylinders can be inflated with a fluid that helps to create an erection, and they can be deflated after intercourse. °A semi-rigid implant. This consists of two silicone rubber rods. The rods provide some rigidity. They are also flexible, so the penis can both curve downward in its normal position and become straight for sexual intercourse. °Blood vessel surgery to improve blood flow to the penis. During this procedure, a blood vessel from a different part of the body is placed into the penis to allow blood to flow around (bypass) damaged or blocked blood vessels. °Lifestyle changes,   such as exercising more, losing weight, and quitting smoking. °Follow  these instructions at home: °Medicines ° °Take over-the-counter and prescription medicines only as told by your health care provider. Do not increase the dosage without first discussing it with your health care provider. °If you are using self-injections, do injections as directed by your health care provider. Make sure you avoid any veins that are on the surface of the penis. After giving an injection, apply pressure to the injection site for 5 minutes. °Talk to your health care provider about how to prevent headaches while taking ED medicines. These medicines may cause a sudden headache due to the increase in blood flow in your body. °General instructions °Exercise regularly, as directed by your health care provider. Work with your health care provider to lose weight, if needed. °Do not use any products that contain nicotine or tobacco. These products include cigarettes, chewing tobacco, and vaping devices, such as e-cigarettes. If you need help quitting, ask your health care provider. °Before using a vacuum pump, read the instructions that come with the pump and discuss any questions with your health care provider. °Keep all follow-up visits. This is important. °Contact a health care provider if: °You feel nauseous. °You are vomiting. °You get sudden headaches while taking ED medicines. °You have any concerns about your sexual health. °Get help right away if: °You are taking oral or injectable medicines and you have an erection that lasts longer than 4 hours. If your health care provider is unavailable, go to the nearest emergency room for evaluation. An erection that lasts much longer than 4 hours can result in permanent damage to your penis. °You have severe pain in your groin or abdomen. °You develop redness or severe swelling of your penis. °You have redness spreading at your groin or lower abdomen. °You are unable to urinate. °You experience chest pain or a rapid heartbeat (palpitations) after taking oral  medicines. °These symptoms may represent a serious problem that is an emergency. Do not wait to see if the symptoms will go away. Get medical help right away. Call your local emergency services (911 in the [place] Do not drive yourself to the hospital. °Summary °Erectile dysfunction (ED) is the inability to get or keep an erection during sexual intercourse. °This condition is diagnosed based on a physical exam, your symptoms, and tests to determine the cause. Treatment varies depending on the cause and may include medicines, hormone therapy, surgery, or a vacuum pump. °You may need follow-up visits to make sure that you are using your medicines or devices correctly. °Get help right away if you are taking or injecting medicines and you have an erection that lasts longer than 4 hours. °This information is not intended to replace advice given to you by your health care provider. Make sure you discuss any questions you have with your health care provider. °Document Revised: 11/26/2020 Document Reviewed: 11/26/2020 °Elsevier Patient Education © 2022 [employer] ° °

## 2021-08-03 IMAGING — US US PELVIS LIMITED
1 series · 14 of 25 positions shown · non-contrast
Comparison: None.

CLINICAL DATA: Penile mass at LEFT base of penis most often felt
with erection

EXAM:
LIMITED ULTRASOUND OF PELVIS
TECHNIQUE: Limited transabdominal ultrasound examination of the penis was
performed.

[Series 1: us pelvis limited · 0.04mm/px · 28 acquisitions, 14 frames shown]
[im 1/28]
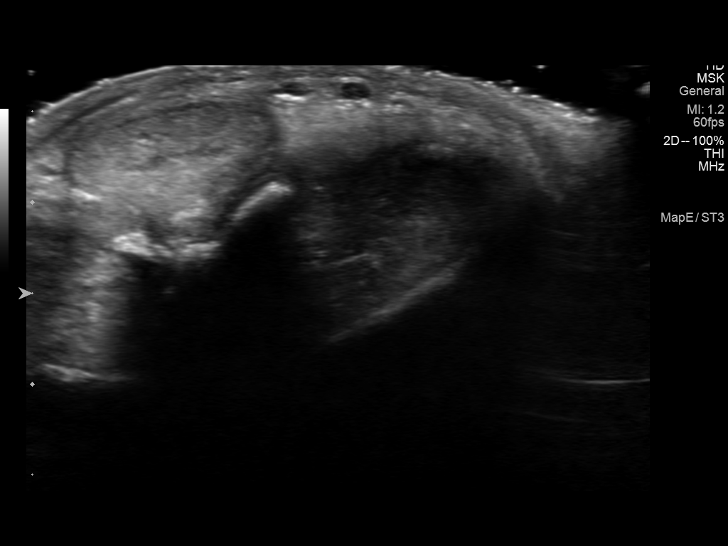
[im 3/28]
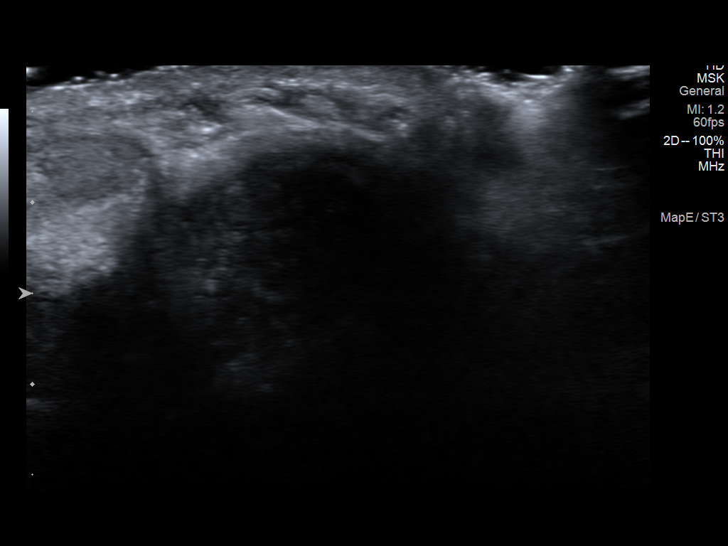
[im 5/28]
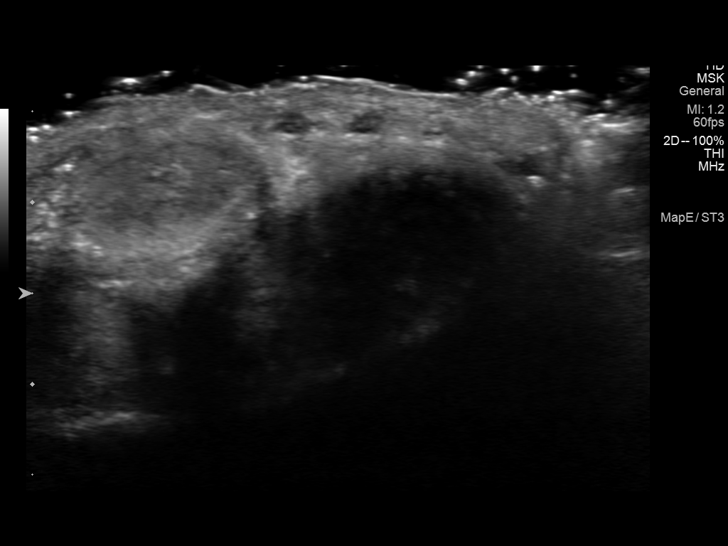
[im 7/28]
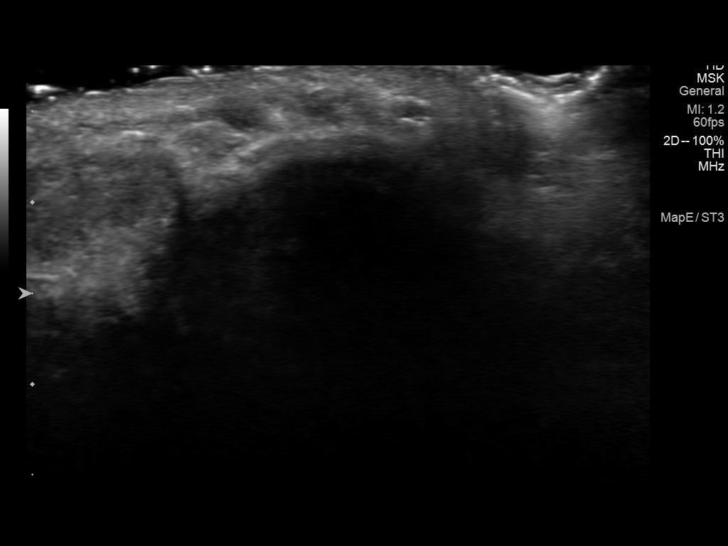
[im 10/28]
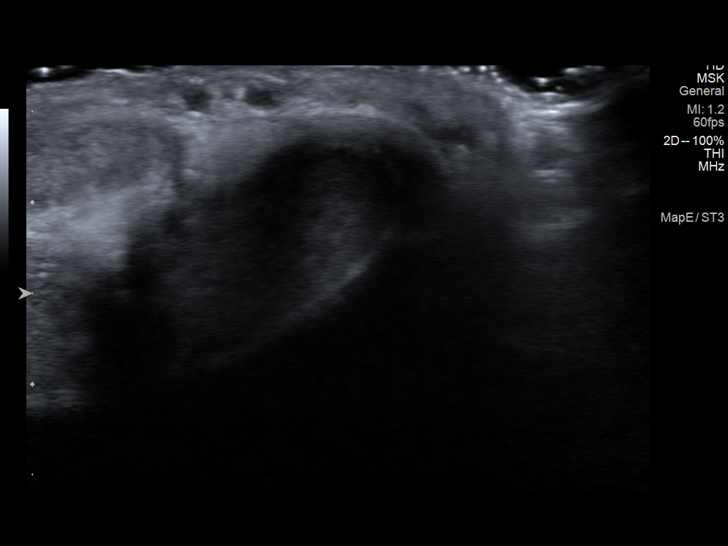
[im 11/28]
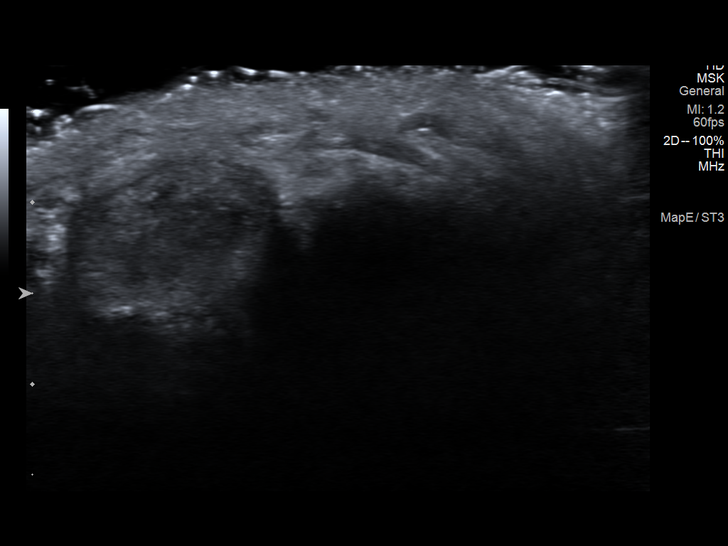
[im 13/28]
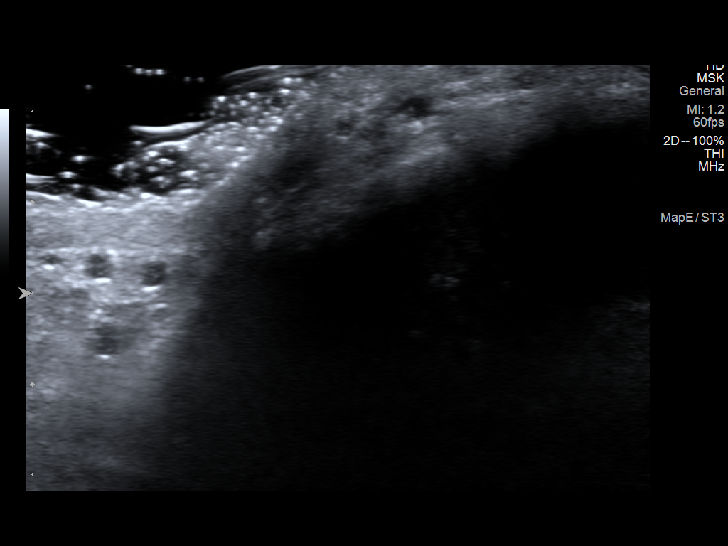
[im 15/28]
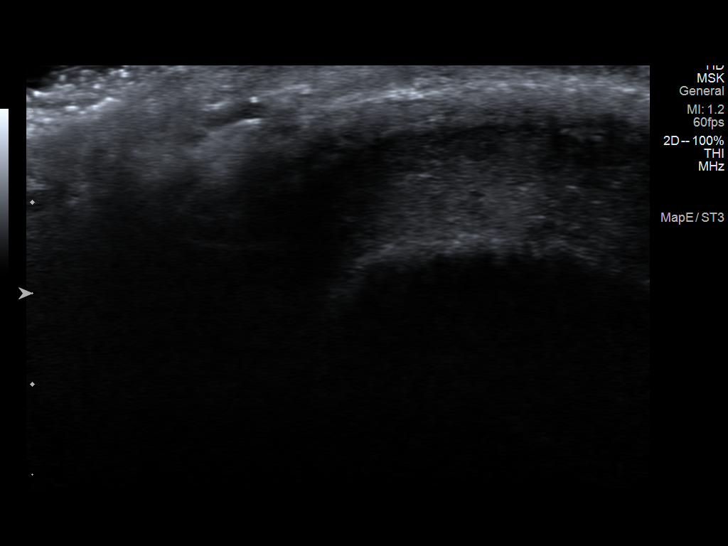
[im 17/28]
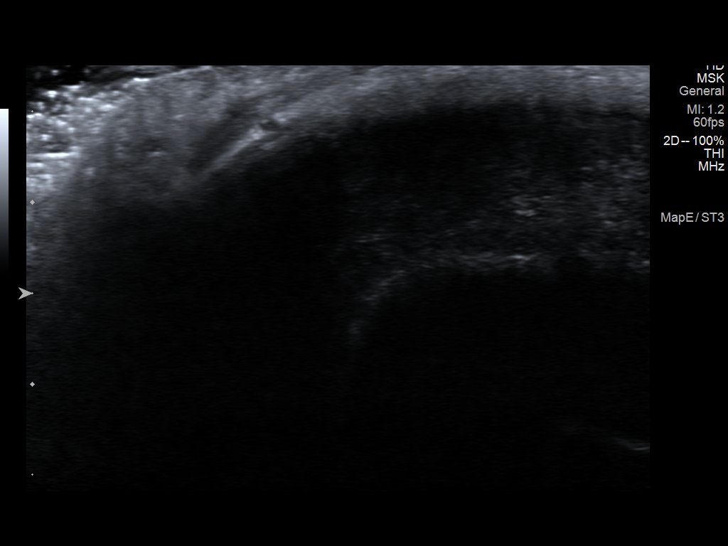
[im 19/28]
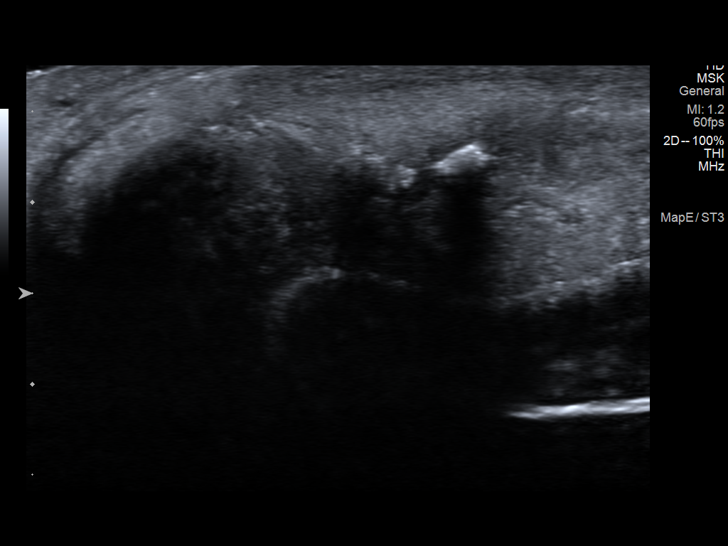
[im 21/28]
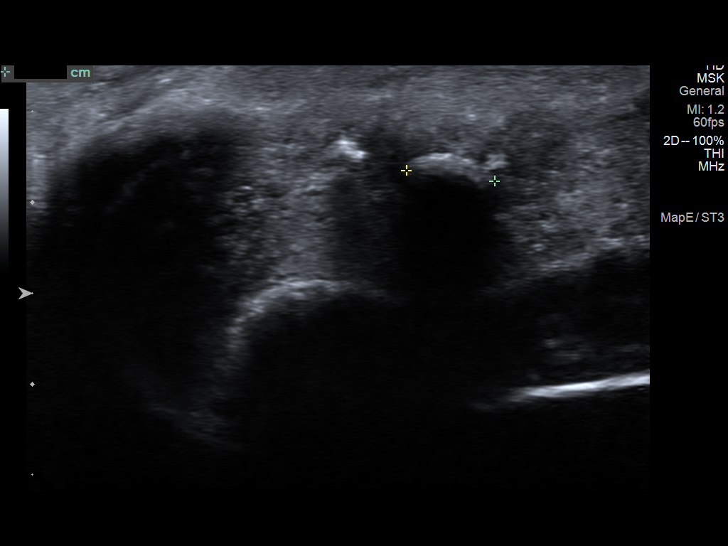
[im 23/28]
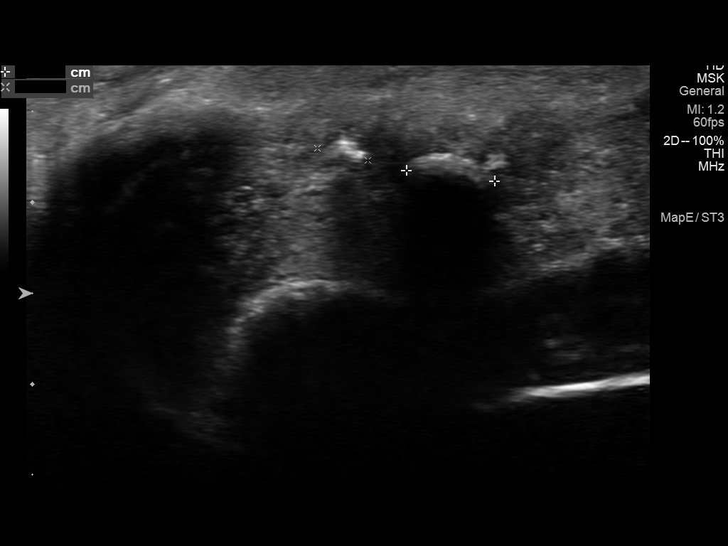
[im 25/28]
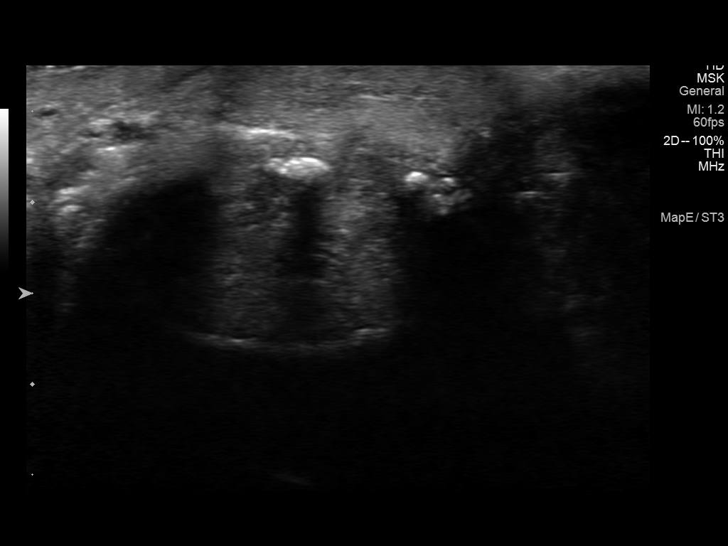
[im 28/28]
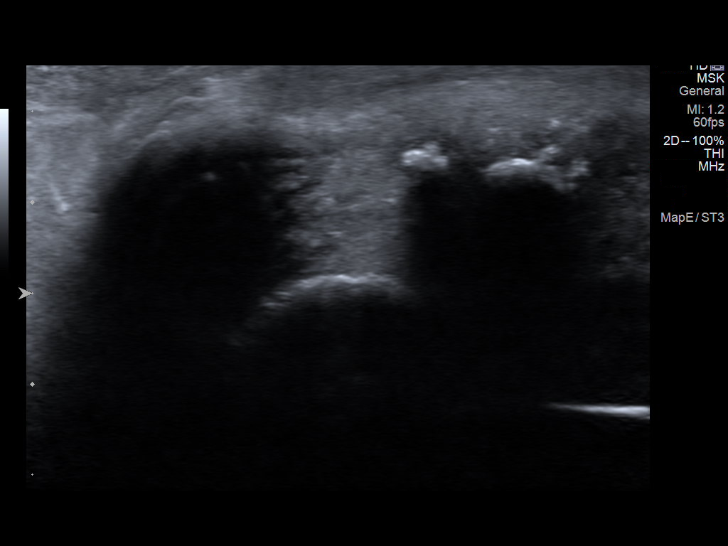

[14 of 25 positions shown; findings below may reference images not displayed]

FINDINGS: At the base of the penis on the LEFT at the palpable area of
clinical concern, several curvilinear shadowing calcifications are
identified. Appearance is consistent with Peyronie disease disease.
No soft tissue mass is identified. No abnormal fluid collections.
IMPRESSION: Curvilinear calcifications within the soft tissues at the LEFT base
of the penis at the palpable area of clinical concern consistent
with Peyronie disease.

## 2021-08-14 ENCOUNTER — Encounter: Payer: Self-pay | Admitting: "Endocrinology

## 2021-08-14 ENCOUNTER — Other Ambulatory Visit: Payer: Self-pay

## 2021-08-14 ENCOUNTER — Ambulatory Visit (INDEPENDENT_AMBULATORY_CARE_PROVIDER_SITE_OTHER): Payer: 59 | Admitting: "Endocrinology

## 2021-08-14 VITALS — BP 126/88 | HR 92 | Ht 71.0 in | Wt 253.0 lb

## 2021-08-14 DIAGNOSIS — E1159 Type 2 diabetes mellitus with other circulatory complications: Secondary | ICD-10-CM | POA: Insufficient documentation

## 2021-08-14 DIAGNOSIS — I10 Essential (primary) hypertension: Secondary | ICD-10-CM | POA: Insufficient documentation

## 2021-08-14 DIAGNOSIS — E782 Mixed hyperlipidemia: Secondary | ICD-10-CM

## 2021-08-14 MED ORDER — GLIPIZIDE ER 5 MG PO TB24: 5.0000 mg | ORAL_TABLET | Freq: Every day | ORAL | 3 refills | Status: DC

## 2021-08-14 NOTE — Progress Notes (Signed)
Endocrinology Consult Note       08/14/2021, 11:40 AM   Subjective:    Patient ID: Don Owens, male    DOB: May 26, 1976.  Don Owens is being seen in consultation for management of currently uncontrolled symptomatic diabetes requested by  Leonie Douglas, MD.   Past Medical History:  Diagnosis Date   Diabetes mellitus    dx age 45   Heart attack ([place])    Hyperlipidemia    Hypertension    PTSD (post-traumatic stress disorder)    Sleep apnea    Stroke [facility])     History reviewed. No pertinent surgical history.  Social History   Socioeconomic History   Marital status: Married    Spouse name: Not on file   Number of children: Not on file   Years of education: Not on file   Highest education level: Not on file  Occupational History   Not on file  Tobacco Use   Smoking status: Every Day    Packs/day: 1.00    Types: Cigarettes   Smokeless tobacco: Never  Vaping Use   Vaping Use: Former  Substance and Sexual Activity   Alcohol use: Not Currently    Comment: occ   Drug use: Not Currently    Types: Cocaine, Marijuana   Sexual activity: Yes    Birth control/protection: None  Other Topics Concern   Not on file  Social History Narrative   ** Merged History Encounter **       Social Determinants of Health   Financial Resource Strain: Not on file  Food Insecurity: Not on file  Transportation Needs: Not on file  Physical Activity: Not on file  Stress: Not on file  Social Connections: Not on file    Family History  Problem Relation Age of Onset   Kidney disease Mother    Heart disease Mother    Diabetes Mother    Stroke Mother    Stroke Father    Hypertension Father     Outpatient Encounter Medications as of 08/14/2021  Medication Sig   acetaminophen (TYLENOL) 500 MG tablet Take 500 mg by mouth every 6 (six) hours as needed.   glipiZIDE (GLUCOTROL XL) 5 MG 24 hr tablet  Take 1 tablet (5 mg total) by mouth daily with breakfast.   amitriptyline (ELAVIL) 50 MG tablet Take 50 mg by mouth at bedtime.   aspirin EC 81 MG tablet Take 1 tablet (81 mg total) by mouth daily.   buPROPion (WELLBUTRIN SR) 200 MG 12 hr tablet Take 200 mg by mouth 2 (two) times daily.   citalopram (CELEXA) 20 MG tablet Take 20 mg by mouth daily. (Patient not taking: Reported on 08/14/2021)   famotidine (PEPCID) 20 MG tablet Take 20 mg by mouth 2 (two) times daily.   gabapentin (NEURONTIN) 300 MG capsule Take 1 capsule (300 mg total) by mouth 2 (two) times daily. (Patient taking differently: Take 300 mg by mouth 2 (two) times daily. As needed)   ibuprofen (ADVIL) 200 MG tablet Take 200 mg by mouth every 6 (six) hours as needed.   loratadine (CLARITIN) 10 MG tablet Take 20  mg by mouth daily.   metFORMIN (GLUCOPHAGE) 1000 MG tablet Take 1,000 mg by mouth 2 (two) times daily.   metoprolol tartrate (LOPRESSOR) 100 MG tablet Take 1 tablet (100 mg total) by mouth once for 1 dose.   Multiple Vitamin (MULTIVITAMIN) capsule Take 1 capsule by mouth daily. (Patient not taking: Reported on 08/14/2021)   tamsulosin (FLOMAX) 0.4 MG CAPS capsule Take 1 capsule (0.4 mg total) by mouth daily after supper.   traZODone (DESYREL) 100 MG tablet Take 100 mg by mouth at bedtime.   No facility-administered encounter medications on file as of 08/14/2021.    ALLERGIES: No Known Allergies  VACCINATION STATUS:  There is no immunization history on file for this patient.  Diabetes He presents for his initial diabetic visit. He has type 2 diabetes mellitus. Onset time: He was diagnosed at approximate age of 37 years. There are no hypoglycemic associated symptoms. Pertinent negatives for hypoglycemia include no confusion, headaches, pallor or seizures. Associated symptoms include blurred vision, polydipsia and polyuria. Pertinent negatives for diabetes include no chest pain, no fatigue, no polyphagia and no weakness. There  are no hypoglycemic complications. Symptoms are worsening. Diabetic complications include a CVA, heart disease and peripheral neuropathy. (This patient was diagnosed with coronary artery disease at age 77, and CVA at age 69.) Risk factors for coronary artery disease include dyslipidemia, diabetes mellitus, hypertension, male sex, obesity, family history, tobacco exposure, sedentary lifestyle and stress. Current diabetic treatments: He is currently on metformin 1000 mg p.o. twice daily. His weight is increasing steadily. He is following a generally unhealthy diet. When asked about meal planning, he reported none. He has not had a previous visit with a dietitian. He never participates in exercise. (He did not bring any logs nor meter with him.  His recent A1c was 12.3%.  His previous A1c was also 12.1%.) An ACE inhibitor/angiotensin II receptor blocker is not being taken.  Hyperlipidemia This is a chronic problem. The current episode started more than 1 year ago. Exacerbating diseases include diabetes and obesity. Pertinent negatives include no chest pain, myalgias or shortness of breath. He is currently on no antihyperlipidemic treatment. Risk factors for coronary artery disease include diabetes mellitus, dyslipidemia, hypertension, male sex, a sedentary lifestyle, family history and obesity.  Hypertension This is a chronic problem. The current episode started more than 1 year ago. Associated symptoms include blurred vision. Pertinent negatives include no chest pain, headaches, neck pain, palpitations or shortness of breath. Risk factors for coronary artery disease include family history, dyslipidemia, diabetes mellitus, male gender, obesity, sedentary lifestyle and smoking/tobacco exposure. Past treatments include beta blockers. Hypertensive end-organ damage includes CVA.    Review of Systems  Constitutional:  Negative for chills, fatigue, fever and unexpected weight change.  HENT:  Negative for dental  problem, mouth sores and trouble swallowing.   Eyes:  Positive for blurred vision. Negative for visual disturbance.  Respiratory:  Negative for cough, choking, chest tightness, shortness of breath and wheezing.   Cardiovascular:  Negative for chest pain, palpitations and leg swelling.  Gastrointestinal:  Negative for abdominal distention, abdominal pain, constipation, diarrhea, nausea and vomiting.  Endocrine: Positive for polydipsia and polyuria. Negative for polyphagia.  Genitourinary:  Negative for dysuria, flank pain, hematuria and urgency.  Musculoskeletal:  Negative for back pain, gait problem, myalgias and neck pain.  Skin:  Negative for pallor, rash and wound.  Neurological:  Negative for seizures, syncope, weakness, numbness and headaches.  Psychiatric/Behavioral:  Negative for confusion and dysphoric mood.  Objective:    Vitals with BMI 08/14/2021 07/16/2021 06/08/2021  Height 5\' 11"  5\' 11"  -  Weight 253 lbs 257 lbs -  BMI [id number] [id number] -  Systolic [id number]  Diastolic 88 90 78  Pulse 92 105 97  Some encounter information is confidential and restricted. Go to Review Flowsheets activity to see all data.    BP 126/88   Pulse 92   Ht 5\' 11"  (1.803 m)   Wt 253 lb (114.8 kg)   BMI 35.29 kg/m   Wt Readings from Last 3 Encounters:  08/14/21 253 lb (114.8 kg)  07/16/21 257 lb (116.6 kg)  11/12/20 264 lb (119.7 kg)     Physical Exam Constitutional:      General: He is not in acute distress.    Appearance: He is well-developed.     Comments: Dysphoric mood.  HENT:     Head: Normocephalic and atraumatic.  Neck:     Thyroid: No thyromegaly.     Trachea: No tracheal deviation.  Cardiovascular:     Rate and Rhythm: Normal rate.     Pulses:          Dorsalis pedis pulses are 1+ on the right side and 1+ on the left side.       Posterior tibial pulses are 1+ on the right side and 1+ on the left side.     Heart sounds: Normal heart sounds, S1 normal and S2 normal. No murmur  heard.   No gallop.  Pulmonary:     Effort: No respiratory distress.     Breath sounds: Normal breath sounds. No wheezing.  Abdominal:     General: Bowel sounds are normal. There is no distension.     Palpations: Abdomen is soft.     Tenderness: There is no abdominal tenderness. There is no guarding.  Musculoskeletal:     Right shoulder: No swelling or deformity.     Cervical back: Normal range of motion and neck supple.  Skin:    General: Skin is warm and dry.     Findings: No rash.     Nails: There is no clubbing.  Neurological:     Mental Status: He is alert and oriented to person, place, and time.     Cranial Nerves: No cranial nerve deficit.     Sensory: No sensory deficit.     Gait: Gait normal.     Deep Tendon Reflexes: Reflexes are normal and symmetric.  Psychiatric:        Speech: Speech normal.        Behavior: Behavior normal. Behavior is cooperative.        Thought Content: Thought content normal.        Judgment: Judgment normal.      CMP ( most recent) CMP     Component Value Date/Time   NA 135 11/12/2020 1819   K 3.9 11/12/2020 1819   CL 97 (L) 11/12/2020 1819   CO2 27 11/12/2020 1819   GLUCOSE 432 (H) 11/12/2020 1819   BUN 12 11/12/2020 1819   CREATININE 1.02 11/12/2020 1819   CALCIUM 9.0 11/12/2020 1819   PROT 6.6 06/02/2019 0331   ALBUMIN 3.7 06/02/2019 0331   AST 15 06/02/2019 0331   ALT 28 06/02/2019 0331   ALKPHOS 91 06/02/2019 0331   BILITOT 0.6 06/02/2019 0331   GFRNONAA >60 11/12/2020 1819   GFRAA >60 01/08/2020 1332     Diabetic Labs (most recent): Lab Results  Component Value Date  HGBA1C 7.2 (H) 01/08/2020   HGBA1C 8.3 (H) 10/12/2019   HGBA1C 10.4 (H) 06/14/2019     Lipid Panel ( most recent) Lipid Panel     Component Value Date/Time   CHOL 153 06/14/2019 0900   TRIG 147 06/14/2019 0900   HDL 47 06/14/2019 0900   CHOLHDL 3.3 06/14/2019 0900   VLDL 29 06/14/2019 0900   LDLCALC 77 06/14/2019 0900      Lab Results   Component Value Date   TSH 2.404 06/14/2019      Assessment & Plan:   1. DM type 2 causing vascular disease ([place])    - Seward Grater has currently uncontrolled symptomatic type 2 DM since  45 years of age,  with most recent A1c of 12.3 %. Recent labs reviewed. - I had a long discussion with him about the progressive nature of diabetes and the pathology behind its complications. -his diabetes is complicated by coronary artery disease, CVA, obesity, sedentary life, smoking, mood disorder including depression and he remains at a high risk for more acute and chronic complications which include CAD, CVA, CKD, retinopathy, and neuropathy. These are all discussed in detail with him.  - I have counseled him on diet  and weight management  by adopting a carbohydrate restricted/protein rich diet. Patient is encouraged to switch to  unprocessed or minimally processed     complex starch and increased protein intake (animal or plant source), fruits, and vegetables. -  he is advised to stick to a routine mealtimes to eat 3 meals  a day and avoid unnecessary snacks ( to snack only to correct hypoglycemia).   - he acknowledges that there is a room for improvement in his food and drink choices. - Suggestion is made for him to avoid simple carbohydrates  from his diet including Cakes, Sweet Desserts, Ice Cream, Soda (diet and regular), Sweet Tea, Candies, Chips, Cookies, Store Bought Juices, Alcohol in Excess of  1-2 drinks a day, Artificial Sweeteners,  Coffee Creamer, and "Sugar-free" Products. This will help patient to have more stable blood glucose profile and potentially avoid unintended weight gain.  - he will be scheduled with Jearld Fenton, RDN, CDE for diabetes education.  - I have approached him with the following individualized plan to manage  his diabetes and patient agrees:   -In light of his presentation with chronic, severe glycemic burden, he will need insulin treatment to control his  diabetes to Target. -However, this patient is hesitant to add more medication at this time.  In fact, the single most important intervention for him would be lifestyle medicine. -I had a long discussion with him recommending discussing Whole Foods, plant predominant lifestyle nutrition.  This plan will help with his diabetes, hyperlipidemia, hypertension, sleep apnea, mood disorders, and obesity. -Details of meal prep, grocery shopping list provided to him. -In the meantime, he is approached to monitor blood glucose 4 times a day-before meals and at bedtime and return in 10 days with his meter and logs.   -He is advised to continue metformin 1000 mg p.o. twice daily, discussed and added glipizide 5 mg XL p.o. daily at breakfast. - he is encouraged to call clinic for blood glucose levels less than 70 or above 300 mg /dl. -If he returns with significant glycemic burden at fasting and postprandial, he will be approached for insulin treatment. - he will be considered for incretin therapy as appropriate next visit.  - Specific targets for  A1c;  LDL, HDL,  and  Triglycerides were discussed with the patient.  2) Blood Pressure /Hypertension:  his blood pressure is  controlled to target.  He is on metoprolol 100 mg p.o. daily at breakfast, advised to continue.  3) Lipids/Hyperlipidemia: He does not have recent lipid panel to review.  He is not on statins.  He will have fasting lipid panel on subsequent visits.   4)  Weight/Diet:  Body mass index is 35.29 kg/m.  -   clearly complicating his diabetes care.   he is  a candidate for weight loss. I discussed with him the fact that loss of 5 - 10% of his  current body weight will have the most impact on his diabetes management.   Plant Predominant , Whole Foods- Lifestyle Nutrition is discussed and recommended to the patient. Optimal Exercise, Restorative Sleep  information was detailed on discharge instructions.  5) Chronic Care/Health Maintenance:  -he  is  not  on ACEI/ARB and Statin medications and  is encouraged to initiate and continue to follow up with Ophthalmology, Dentist,  Podiatrist at least yearly or according to recommendations, and advised to  quit smoking. I have recommended yearly flu vaccine and pneumonia vaccine at least every 5 years; moderate intensity exercise for up to 150 minutes weekly; and  sleep for 7- 9 hours a day.  Patient is accompanied by his significant other as well as grandchild to the clinic.  The patient was counseled on the dangers of tobacco use, and was advised to quit.  Reviewed strategies to maximize success, including removing cigarettes and smoking materials from environment.   - he is  advised to maintain close follow up with Leonie Douglas, MD for primary care needs, as well as his other providers for optimal and coordinated care.   I spent 85 minutes in the care of the patient today including review of labs from [place], Lipids, Thyroid Function, Hematology (current and previous including abstractions from other facilities); face-to-face time discussing  his blood glucose readings/logs, discussing hypoglycemia and hyperglycemia episodes and symptoms, medications doses, his options of short and long term treatment based on the latest standards of care / guidelines;  discussion about incorporating lifestyle medicine;  and documenting the encounter.     Please refer to Patient Instructions for Blood Glucose Monitoring and Insulin/Medications Dosing Guide"  in media tab for additional information. Please  also refer to " Patient Self Inventory" in the Media  tab for reviewed elements of pertinent patient history.  Seward Grater participated in the discussions, expressed understanding, and voiced agreement with the above plans.  All questions were answered to his satisfaction. he is encouraged to contact clinic should he have any questions or concerns prior to his return visit.   Follow up plan: - Return in about  10 days (around 08/24/2021) for F/U with Meter and Logs Only - no Labs, NV with Janee Ureste.  Glade Lloyd, MD [facility] [address] [place], [place] [address] Phone: [phone]  Fax: [phone]    08/14/2021, 11:40 AM  This note was partially dictated with voice recognition software. Similar sounding words can be transcribed inadequately or may not  be corrected upon review.

## 2021-08-14 NOTE — Patient Instructions (Signed)

## 2021-08-18 ENCOUNTER — Encounter: Payer: 59 | Attending: "Endocrinology | Admitting: Nutrition

## 2021-08-18 ENCOUNTER — Encounter: Payer: Self-pay | Admitting: Nutrition

## 2021-08-18 ENCOUNTER — Other Ambulatory Visit: Payer: Self-pay

## 2021-08-18 VITALS — Ht 72.0 in | Wt 254.6 lb

## 2021-08-18 DIAGNOSIS — E782 Mixed hyperlipidemia: Secondary | ICD-10-CM | POA: Insufficient documentation

## 2021-08-18 DIAGNOSIS — I10 Essential (primary) hypertension: Secondary | ICD-10-CM | POA: Insufficient documentation

## 2021-08-18 DIAGNOSIS — E1159 Type 2 diabetes mellitus with other circulatory complications: Secondary | ICD-10-CM | POA: Insufficient documentation

## 2021-08-18 NOTE — Progress Notes (Signed)
Medical Nutrition Therapy  Appointment Start time:  [phone] Appointment End time:  0945  Primary concerns today:Dm Type 2  Referral diagnosis: E11.8 Preferred learning style: no preference. Learning readiness: Changes in process   NUTRITION ASSESSMENT  Here with his wife today. Hasn't been working. 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 Doesn't have any insurance. Sees Dr. Fransico Him, Endocrinology. PCP Dr. Dahlia Client.   Is not on patient assistance program yet. Will call [facility] to see about getting help with medications.   Testing 4 times per day but has been using an old meter with testing strips over a year old. Unsure if his strips are expired or not. He will check.  I agave him a One Touch Verio meter and sample strips to test for 20+ days until he can afford to get a relion meter and testing supplies from [place].  FBS: 282-325 mg/dl  Before lunch 836'O and Before dinner: 340's Bedtime:  300-432s.  He has been working on eating healthier foods -salads and more healthier protein choices.  Referred them to food pantries and patient assistance for medications at [facility]  Admits to being depressed. Has been working with a Veterinary surgeon at [employer] clinic. Is on Trazadone 100 mg and he feels like that makes him sleep a lot. But taking only 50 mg dose makes him not sleep.  Has cut out sodas and sweets and trying to eat healthier. Financially he is limited. Admits to his living situation is not ideal and needs to find better housing.  Willing to try to work on eating more plant based foods.  Anthropometrics  Wt Readings from Last 3 Encounters:  08/14/21 253 lb (114.8 kg)  07/16/21 257 lb (116.6 kg)  11/12/20 264 lb (119.7 kg)   Ht Readings from Last 3 Encounters:  08/14/21 5\' 11"  (1.803 m)  07/16/21 5\' 11"  (1.803 m)  11/12/20 5\' 11"  (1.803 m)   There is no height or weight on file to calculate BMI. @BMIFA @ Facility age limit for  growth percentiles is 20 years. Facility age limit for growth percentiles is 20 years.   Last A1C was reported to be > 12%.  Clinical Medical Hx: HTN, Hyperlipidemia, Obesity, DM Type 2 and Depression Medications: see list: Metformin 1000 mg BID, Going to pick up glipizide today. Labs:  Lab Results  Component Value Date   HGBA1C 7.2 (H) 01/08/2020  A1C  reported to be over 12%.   Notable Signs/Symptoms: Increased thirst, frequent urination, thrush, tired, blurry vision and symptoms of low blood sugars.  Lifestyle & Dietary Hx Lives with wife, cares for his grandkids, has step son and other children in home also. Doesn't work but is going to start new job next week at 01/12/21.  Estimated daily fluid intake: 64 oz Supplements:  Sleep: too much 12= hrs a day Stress / self-care: family and lack of income and food Current average weekly physical activity: ADL  24-Hr Dietary Recall First Meal: skips Snack:  First  Meal: 430 pm : salad with veggies, salad toppings, creamy ,  water Snack:  corn 1/2 c water  Third Meal: 8 pm 2 cheese, tomato and sandwiches on wheat bread, , water  8 pm [employer] chili  15 oz can, water  Snack:  Beverages: water only  Estimated Energy Needs Calories: 1800 Carbohydrate: 200g Protein: 135g Fat: 50g   NUTRITION DIAGNOSIS  NB-1.1 Food and nutrition-related knowledge deficit As related to Diabetes Type  2.  As evidenced by A1C > 12%.   NUTRITION INTERVENTION  Nutrition education (E-1) on the following topics:  Nutrition and Diabetes education provided on My Plate, CHO counting, meal planning, portion sizes, timing of meals, avoiding snacks between meals unless having a low blood sugar, target ranges for A1C and blood sugars, signs/symptoms and treatment of hyper/hypoglycemia, monitoring blood sugars, taking medications as prescribed, benefits of exercising 30 minutes per day and prevention of complications of  DM. Lifestyle Medicine - Whole Food, Plant Predominant Nutrition is highly recommended: Eat Plenty of vegetables, Mushrooms, fruits, Legumes, Whole Grains, Nuts, seeds in lieu of processed meats, processed snacks/pastries red meat, poultry, eggs.    -It is better to avoid simple carbohydrates including: Cakes, Sweet Desserts, Ice Cream, Soda (diet and regular), Sweet Tea, Candies, Chips, Cookies, Store Bought Juices, Alcohol in Excess of  1-2 drinks a day, Lemonade,  Artificial Sweeteners, Doughnuts, Coffee Creamers, "Sugar-free" Products, etc, etc.  This is not a complete list.....  Exercise: If you are able: 30 -60 minutes a day ,4 days a week, or 150 minutes a week.  The longer the better.  Combine stretch, strength, and aerobic activities.  If you were told in the past that you have high risk for cardiovascular diseases, you may seek evaluation by your heart doctor prior to initiating moderate to intense exercise programs.  Handouts Provided Include  My Plate Diabetes Instructions Signs of hyper/hypoglycemai  Learning Style & Readiness for Change Teaching method utilized: Visual & Auditory  Demonstrated degree of understanding via: Teach Back  Barriers to learning/adherence to lifestyle change: depression, food and financial resources  Goals Established by Pt Eat three meals per day at times discussed. Drink only water Increase fresh fruits and vegetables and more plant based foods Walk 30 minutes a day Cut out processed foods Test Blood sugars 4 times per day  Lifestyle Medicine - Whole Food, Plant Predominant Nutrition is highly recommended: Eat Plenty of vegetables, Mushrooms, fruits, Legumes, Whole Grains, Nuts, seeds in lieu of processed meats, processed snacks/pastries red meat, poultry, eggs.    -It is better to avoid simple carbohydrates including: Cakes, Sweet Desserts, Ice Cream, Soda (diet and regular), Sweet Tea, Candies, Chips, Cookies, Store Bought Juices, Alcohol in  Excess of  1-2 drinks a day, Lemonade,  Artificial Sweeteners, Doughnuts, Coffee Creamers, "Sugar-free" Products, etc, etc.  This is not a complete list.....  Exercise: If you are able: 30 -60 minutes a day ,4 days a week, or 150 minutes a week.  The longer the better.  Combine stretch, strength, and aerobic activities.  If you were told in the past that you have high risk for cardiovascular diseases, you may seek evaluation by your heart doctor prior to initiating moderate to intense exercise programs.  MONITORING & EVALUATION Dietary intake, weekly physical activity, and blood sugars in 1 week.Marland Kitchen  Next Steps  Patient is to work on eating three meals per day and taking medications as prescribed. Marland Kitchen

## 2021-08-18 NOTE — Patient Instructions (Signed)
Goals Established by Pt Eat three meals per day at times discussed. Drink only water Increase fresh fruits and vegetables and more plant based foods Walk 30 minutes a day Cut out processed foods Test Blood sugars 4 times per day  Lifestyle Medicine - Whole Food, Plant Predominant Nutrition is highly recommended: Eat Plenty of vegetables, Mushrooms, fruits, Legumes, Whole Grains, Nuts, seeds in lieu of processed meats, processed snacks/pastries red meat, poultry, eggs.    -It is better to avoid simple carbohydrates including: Cakes, Sweet Desserts, Ice Cream, Soda (diet and regular), Sweet Tea, Candies, Chips, Cookies, Store Bought Juices, Alcohol in Excess of  1-2 drinks a day, Lemonade,  Artificial Sweeteners, Doughnuts, Coffee Creamers, "Sugar-free" Products, etc, etc.  This is not a complete list.....  Exercise: If you are able: 30 -60 minutes a day ,4 days a week, or 150 minutes a week.  The longer the better.  Combine stretch, strength, and aerobic activities.  If you were told in the past that you have high risk for cardiovascular diseases, you may seek evaluation by

## 2021-08-21 ENCOUNTER — Telehealth (HOSPITAL_COMMUNITY): Payer: Self-pay | Admitting: *Deleted

## 2021-08-21 NOTE — Telephone Encounter (Signed)
Reaching out to patient to offer assistance regarding upcoming cardiac imaging study; pt verbalizes understanding of appt date/time, parking situation and where to check in, pre-test NPO status and medications ordered, and verified current allergies; name and call back number provided for further questions should they arise  Larey Brick RN Navigator Cardiac Imaging [facility] [phone] office [phone] cell  Patient to take 100mg  metoprolol tartrate two hours prior to cardiac CT scan.  He is aware to obtain blood work prior scan and to arrive at 9:30am for his 10am scan.

## 2021-08-24 ENCOUNTER — Encounter: Payer: 59 | Attending: "Endocrinology | Admitting: Nutrition

## 2021-08-24 ENCOUNTER — Ambulatory Visit (INDEPENDENT_AMBULATORY_CARE_PROVIDER_SITE_OTHER): Payer: 59 | Admitting: "Endocrinology

## 2021-08-24 ENCOUNTER — Other Ambulatory Visit: Payer: Self-pay

## 2021-08-24 ENCOUNTER — Encounter: Payer: Self-pay | Admitting: "Endocrinology

## 2021-08-24 ENCOUNTER — Other Ambulatory Visit (HOSPITAL_COMMUNITY)
Admission: RE | Admit: 2021-08-24 | Discharge: 2021-08-24 | Disposition: A | Discharge status: Home / Self Care | Payer: 59 | Source: Ambulatory Visit | Attending: Cardiology | Admitting: Cardiology

## 2021-08-24 VITALS — BP 116/78 | HR 116 | Ht 71.0 in | Wt 256.0 lb

## 2021-08-24 DIAGNOSIS — Z01812 Encounter for preprocedural laboratory examination: Secondary | ICD-10-CM | POA: Diagnosis present

## 2021-08-24 DIAGNOSIS — I10 Essential (primary) hypertension: Secondary | ICD-10-CM | POA: Diagnosis not present

## 2021-08-24 DIAGNOSIS — R079 Chest pain, unspecified: Secondary | ICD-10-CM

## 2021-08-24 DIAGNOSIS — E1159 Type 2 diabetes mellitus with other circulatory complications: Secondary | ICD-10-CM

## 2021-08-24 DIAGNOSIS — E782 Mixed hyperlipidemia: Secondary | ICD-10-CM | POA: Diagnosis present

## 2021-08-24 LAB — BASIC METABOLIC PANEL
Anion gap: 6 (ref 5–15)
BUN: 13 mg/dL (ref 6–20)
CO2: 28 mmol/L (ref 22–32)
Calcium: 9.3 mg/dL (ref 8.9–10.3)
Chloride: 102 mmol/L (ref 98–111)
Creatinine, Ser: 0.93 mg/dL (ref 0.61–1.24)
GFR, Estimated: >60 mL/min (ref >60)
Glucose, Bld: 242 mg/dL — ABNORMAL HIGH (ref 70–99)
Potassium: 4 mmol/L (ref 3.5–5.1)
Sodium: 136 mmol/L (ref 135–145)

## 2021-08-24 MED ORDER — BD PEN NEEDLE SHORT U/F 31G X 8 MM MISC: 1.0000 | 3 refills | Status: AC

## 2021-08-24 MED ORDER — FREESTYLE LIBRE 2 SENSOR MISC: 1.0000 | 3 refills | Status: DC

## 2021-08-24 MED ORDER — FREESTYLE LIBRE 2 READER DEVI: 0 refills | Status: DC

## 2021-08-24 MED ORDER — TRESIBA FLEXTOUCH 200 UNIT/ML ~~LOC~~ SOPN: 20.0000 [IU] | PEN_INJECTOR | Freq: Every day | SUBCUTANEOUS | 2 refills | Status: DC

## 2021-08-24 NOTE — Patient Instructions (Signed)

## 2021-08-24 NOTE — Progress Notes (Signed)
Medical Nutrition Therapy  Primary concerns today:Dm Type 2  Referral diagnosis: E11.8 Preferred learning style: no preference. Learning readiness: Changes in process   NUTRITION ASSESSMENT Follow up Has an accucheck machine.  Metformin 1000 mg BID- crushes it so he can swallow it.  Glipizide-can't take because it won't  crush. Can't swallow big pills.  Changes made: Trying to eat three meals per day. Eating toast and PB for breakfast. Has been only drinking water and black coffee  Stays depressed Oct til Feburary due to a lot of loss of family friends.  Here with his wife today. Hasn't been working. 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 Doesn't have any insurance. Sees Dr. Fransico Him, Endocrinology. PCP Dr. Dahlia Client.  Is not on patient assistance program yet. Will call [facility] to see about getting help with medications.   Testing 4 times per day but has been using an old meter with testing strips over a year old. Unsure if his strips are expired or not. He will check.  I agave him a One Touch Verio meter and sample strips to test for 20+ days until he can afford to get a relion meter and testing supplies from [place].  FBS: 282-325 mg/dl  Before lunch 938'B and Before dinner: 340's Bedtime:  300-432s.  He has been working on eating healthier foods -salads and more healthier protein choices.  Referred them to food pantries and patient assistance for medications at [facility]  Admits to being depressed. Has been working with a Veterinary surgeon at [employer] clinic. Is on Trazadone 100 mg and he feels like that makes him sleep a lot. But taking only 50 mg dose makes him not sleep.  Has cut out sodas and sweets and trying to eat healthier. Financially he is limited. Admits to his living situation is not ideal and needs to find better housing.  Willing to try to work on eating more plant based foods.  Anthropometrics  Wt Readings from Last 3  Encounters:  08/18/21 254 lb 9.6 oz (115.5 kg)  08/14/21 253 lb (114.8 kg)  07/16/21 257 lb (116.6 kg)   Ht Readings from Last 3 Encounters:  08/18/21 6' (1.829 m)  08/14/21 5\' 11"  (1.803 m)  07/16/21 5\' 11"  (1.803 m)   There is no height or weight on file to calculate BMI. @BMIFA @ Facility age limit for growth percentiles is 20 years. Facility age limit for growth percentiles is 20 years.   Last A1C was reported to be > 12%.  Clinical Medical Hx: HTN, Hyperlipidemia, Obesity, DM Type 2 and Depression Medications: see list: Metformin 1000 mg BID, Going to pick up glipizide today. Labs:  Lab Results  Component Value Date   HGBA1C 7.2 (H) 01/08/2020  A1C  reported to be over 12%.   Notable Signs/Symptoms: Increased thirst, frequent urination, thrush, tired, blurry vision and symptoms of low blood sugars.  Lifestyle & Dietary Hx Lives with wife, cares for his grandkids, has step son and other children in home also. Doesn't work but is going to start new job next week at .  Estimated daily fluid intake: 64 oz Supplements:  Sleep: too much 12= hrs a day Stress / self-care: family and lack of income and food Current average weekly physical activity: ADL  24-Hr Dietary Recall First Meal: PB toast Snack:  Lunch; 2 cheese sandwiches on wheat bread, water  7;30 pm : 01/10/2020 lunch meat sandwich, water   Snack:  Beverages: water only  Estimated Energy Needs Calories: 1800 Carbohydrate: 200g Protein: 135g Fat: 50g   NUTRITION DIAGNOSIS  NB-1.1 Food and nutrition-related knowledge deficit As related to Diabetes Type 2.  As evidenced by A1C > 12%.   NUTRITION INTERVENTION  Nutrition education (E-1) on the following topics:  Nutrition and Diabetes education provided on My Plate, CHO counting, meal planning, portion sizes, timing of meals, avoiding snacks between meals unless having a low blood sugar, target ranges for A1C and blood sugars,  signs/symptoms and treatment of hyper/hypoglycemia, monitoring blood sugars, taking medications as prescribed, benefits of exercising 30 minutes per day and prevention of complications of DM. Lifestyle Medicine - Whole Food, Plant Predominant Nutrition is highly recommended: Eat Plenty of vegetables, Mushrooms, fruits, Legumes, Whole Grains, Nuts, seeds in lieu of processed meats, processed snacks/pastries red meat, poultry, eggs.    -It is better to avoid simple carbohydrates including: Cakes, Sweet Desserts, Ice Cream, Soda (diet and regular), Sweet Tea, Candies, Chips, Cookies, Store Bought Juices, Alcohol in Excess of  1-2 drinks a day, Lemonade,  Artificial Sweeteners, Doughnuts, Coffee Creamers, "Sugar-free" Products, etc, etc.  This is not a complete list.....  Exercise: If you are able: 30 -60 minutes a day ,4 days a week, or 150 minutes a week.  The longer the better.  Combine stretch, strength, and aerobic activities.  If you were told in the past that you have high risk for cardiovascular diseases, you may seek evaluation by your heart doctor prior to initiating moderate to intense exercise programs.  Handouts Provided Include  My Plate Diabetes Instructions Signs of hyper/hypoglycemai  Learning Style & Readiness for Change Teaching method utilized: Visual & Auditory  Demonstrated degree of understanding via: Teach Back  Barriers to learning/adherence to lifestyle change: depression, food and financial resources  Goals Established by Pt Eat three meals per day at times discussed. Drink only water Increase fresh fruits and vegetables and more plant based foods Walk 30 minutes a day Cut out processed foods Test Blood sugars 4 times per day  Lifestyle Medicine - Whole Food, Plant Predominant Nutrition is highly recommended: Eat Plenty of vegetables, Mushrooms, fruits, Legumes, Whole Grains, Nuts, seeds in lieu of processed meats, processed snacks/pastries red meat, poultry, eggs.     -It is better to avoid simple carbohydrates including: Cakes, Sweet Desserts, Ice Cream, Soda (diet and regular), Sweet Tea, Candies, Chips, Cookies, Store Bought Juices, Alcohol in Excess of  1-2 drinks a day, Lemonade,  Artificial Sweeteners, Doughnuts, Coffee Creamers, "Sugar-free" Products, etc, etc.  This is not a complete list.....  Exercise: If you are able: 30 -60 minutes a day ,4 days a week, or 150 minutes a week.  The longer the better.  Combine stretch, strength, and aerobic activities.  If you were told in the past that you have high risk for cardiovascular diseases, you may seek evaluation by your heart doctor prior to initiating moderate to intense exercise programs.  MONITORING & EVALUATION Dietary intake, weekly physical activity, and blood sugars in 1 week.Marland Kitchen  Next Steps  Patient is to work on eating three meals per day and taking medications as prescribed. Marland Kitchen

## 2021-08-24 NOTE — Progress Notes (Signed)
08/24/2021, 6:00 PM  Endocrinology follow-up note   Subjective:    Patient ID: Don Owens, male    DOB: 02-24-1976.  Don Owens is being seen in follow-up after he was seen in consultation for management of currently uncontrolled symptomatic diabetes requested by  Leonie Douglas, MD.   Past Medical History:  Diagnosis Date   Diabetes mellitus    dx age 45   Heart attack ([place])    Hyperlipidemia    Hypertension    PTSD (post-traumatic stress disorder)    Sleep apnea    Stroke [facility])     History reviewed. No pertinent surgical history.  Social History   Socioeconomic History   Marital status: Married    Spouse name: Not on file   Number of children: Not on file   Years of education: Not on file   Highest education level: Not on file  Occupational History   Not on file  Tobacco Use   Smoking status: Every Day    Packs/day: 1.00    Types: Cigarettes   Smokeless tobacco: Never  Vaping Use   Vaping Use: Former  Substance and Sexual Activity   Alcohol use: Not Currently    Comment: occ   Drug use: Not Currently    Types: Cocaine, Marijuana   Sexual activity: Yes    Birth control/protection: None  Other Topics Concern   Not on file  Social History Narrative   ** Merged History Encounter **       Social Determinants of Health   Financial Resource Strain: Not on file  Food Insecurity: Not on file  Transportation Needs: Not on file  Physical Activity: Not on file  Stress: Not on file  Social Connections: Not on file    Family History  Problem Relation Age of Onset   Kidney disease Mother    Heart disease Mother    Diabetes Mother    Stroke Mother    Stroke Father    Hypertension Father     Outpatient Encounter Medications as of 08/24/2021  Medication Sig   Continuous Blood Gluc Receiver (FREESTYLE LIBRE 2 READER) DEVI As directed   Continuous Blood Gluc Sensor  (FREESTYLE LIBRE 2 SENSOR) MISC 1 Piece by Does not apply route every 14 (fourteen) days.   insulin degludec (TRESIBA FLEXTOUCH) 200 UNIT/ML FlexTouch Pen Inject 20 Units into the skin at bedtime.   Insulin Pen Needle (B-D ULTRAFINE III SHORT PEN) 31G X 8 MM MISC 1 each by Does not apply route as directed.   acetaminophen (TYLENOL) 500 MG tablet Take 500 mg by mouth every 6 (six) hours as needed.   amitriptyline (ELAVIL) 50 MG tablet Take 50 mg by mouth at bedtime.   aspirin EC 81 MG tablet Take 1 tablet (81 mg total) by mouth daily.   buPROPion (WELLBUTRIN SR) 200 MG 12 hr tablet Take 200 mg by mouth 2 (two) times daily.   famotidine (PEPCID) 20 MG tablet Take 20 mg by mouth 2 (two) times daily.   gabapentin (NEURONTIN) 300 MG capsule Take 1 capsule (300 mg total) by mouth 2 (two) times daily. (Patient taking differently: Take 300 mg by mouth 2 (  two) times daily. As needed)   ibuprofen (ADVIL) 200 MG tablet Take 200 mg by mouth every 6 (six) hours as needed.   loratadine (CLARITIN) 10 MG tablet Take 20 mg by mouth daily.   metFORMIN (GLUCOPHAGE) 1000 MG tablet Take 1,000 mg by mouth 2 (two) times daily.   metoprolol tartrate (LOPRESSOR) 100 MG tablet Take 1 tablet (100 mg total) by mouth once for 1 dose.   tamsulosin (FLOMAX) 0.4 MG CAPS capsule Take 1 capsule (0.4 mg total) by mouth daily after supper.   traZODone (DESYREL) 100 MG tablet Take 100 mg by mouth at bedtime.   [DISCONTINUED] citalopram (CELEXA) 20 MG tablet Take 20 mg by mouth daily. (Patient not taking: Reported on 08/14/2021)   [DISCONTINUED] glipiZIDE (GLUCOTROL XL) 5 MG 24 hr tablet Take 1 tablet (5 mg total) by mouth daily with breakfast. (Patient not taking: Reported on 08/24/2021)   [DISCONTINUED] Multiple Vitamin (MULTIVITAMIN) capsule Take 1 capsule by mouth daily. (Patient not taking: Reported on 08/14/2021)   No facility-administered encounter medications on file as of 08/24/2021.    ALLERGIES: No Known  Allergies  VACCINATION STATUS:  There is no immunization history on file for this patient.  Diabetes He presents for his follow-up diabetic visit. He has type 2 diabetes mellitus. Onset time: He was diagnosed at approximate age of 35 years. His disease course has been worsening. There are no hypoglycemic associated symptoms. Pertinent negatives for hypoglycemia include no confusion, headaches, pallor or seizures. Associated symptoms include blurred vision, polydipsia and polyuria. Pertinent negatives for diabetes include no chest pain, no fatigue, no polyphagia and no weakness. There are no hypoglycemic complications. Symptoms are worsening. Diabetic complications include a CVA, heart disease and peripheral neuropathy. (This patient was diagnosed with coronary artery disease at age 61, and CVA at age 69.) Risk factors for coronary artery disease include dyslipidemia, diabetes mellitus, hypertension, male sex, obesity, family history, tobacco exposure, sedentary lifestyle and stress. Current diabetic treatments: He is currently on metformin 1000 mg p.o. twice daily. His weight is increasing steadily. He is following a generally unhealthy diet. When asked about meal planning, he reported none. He has not had a previous visit with a dietitian. He never participates in exercise. His home blood glucose trend is fluctuating minimally. His breakfast blood glucose range is generally >200 mg/dl. His lunch blood glucose range is generally >200 mg/dl. His dinner blood glucose range is generally >200 mg/dl. His bedtime blood glucose range is generally >200 mg/dl. His overall blood glucose range is >200 mg/dl. Gaspar Bidding did not make any significant changes in his diet yet.  He presents with significant glycemic burden consistent with his recent A1c of 12.3%.  No hypoglycemia.  Average blood glucose of 250-300 mg/day.   ) An ACE inhibitor/angiotensin II receptor blocker is not being taken.  Hyperlipidemia This is a chronic  problem. The current episode started more than 1 year ago. Exacerbating diseases include diabetes and obesity. Pertinent negatives include no chest pain, myalgias or shortness of breath. He is currently on no antihyperlipidemic treatment. Risk factors for coronary artery disease include diabetes mellitus, dyslipidemia, hypertension, male sex, a sedentary lifestyle, family history and obesity.  Hypertension This is a chronic problem. The current episode started more than 1 year ago. Associated symptoms include blurred vision. Pertinent negatives include no chest pain, headaches, neck pain, palpitations or shortness of breath. Risk factors for coronary artery disease include family history, dyslipidemia, diabetes mellitus, male gender, obesity, sedentary lifestyle and smoking/tobacco exposure. Past treatments include  beta blockers. Hypertensive end-organ damage includes CVA.    Review of Systems  Constitutional:  Negative for chills, fatigue, fever and unexpected weight change.  HENT:  Negative for dental problem, mouth sores and trouble swallowing.   Eyes:  Positive for blurred vision. Negative for visual disturbance.  Respiratory:  Negative for cough, choking, chest tightness, shortness of breath and wheezing.   Cardiovascular:  Negative for chest pain, palpitations and leg swelling.  Gastrointestinal:  Negative for abdominal distention, abdominal pain, constipation, diarrhea, nausea and vomiting.  Endocrine: Positive for polydipsia and polyuria. Negative for polyphagia.  Genitourinary:  Negative for dysuria, flank pain, hematuria and urgency.  Musculoskeletal:  Negative for back pain, gait problem, myalgias and neck pain.  Skin:  Negative for pallor, rash and wound.  Neurological:  Negative for seizures, syncope, weakness, numbness and headaches.  Psychiatric/Behavioral:  Negative for confusion and dysphoric mood.    Objective:    Vitals with BMI 08/24/2021 08/18/2021 08/14/2021  Height 5\' 11"   6\' 0"  5\' 11"   Weight 256 lbs 254 lbs 10 oz 253 lbs  BMI 35.72 [id number]  Systolic 99991111 - [id number]  Diastolic 78 - 88  Pulse 99991111 - 92  Some encounter information is confidential and restricted. Go to Review Flowsheets activity to see all data.    BP 116/78   Pulse (!) 116   Ht 5\' 11"  (1.803 m)   Wt 256 lb (116.1 kg)   BMI 35.70 kg/m   Wt Readings from Last 3 Encounters:  08/24/21 256 lb (116.1 kg)  08/18/21 254 lb 9.6 oz (115.5 kg)  08/14/21 253 lb (114.8 kg)     Physical Exam Constitutional:      General: He is not in acute distress.    Appearance: He is well-developed.     Comments: Dysphoric mood.  HENT:     Head: Normocephalic and atraumatic.  Neck:     Thyroid: No thyromegaly.     Trachea: No tracheal deviation.  Cardiovascular:     Rate and Rhythm: Normal rate.     Pulses:          Dorsalis pedis pulses are 1+ on the right side and 1+ on the left side.       Posterior tibial pulses are 1+ on the right side and 1+ on the left side.     Heart sounds: Normal heart sounds, S1 normal and S2 normal. No murmur heard.   No gallop.  Pulmonary:     Effort: No respiratory distress.     Breath sounds: Normal breath sounds. No wheezing.  Abdominal:     General: Bowel sounds are normal. There is no distension.     Palpations: Abdomen is soft.     Tenderness: There is no abdominal tenderness. There is no guarding.  Musculoskeletal:     Right shoulder: No swelling or deformity.     Cervical back: Normal range of motion and neck supple.  Skin:    General: Skin is warm and dry.     Findings: No rash.     Nails: There is no clubbing.  Neurological:     Mental Status: He is alert and oriented to person, place, and time.     Cranial Nerves: No cranial nerve deficit.     Sensory: No sensory deficit.     Gait: Gait normal.     Deep Tendon Reflexes: Reflexes are normal and symmetric.  Psychiatric:        Speech: Speech normal.  Behavior: Behavior normal. Behavior is  cooperative.        Thought Content: Thought content normal.        Judgment: Judgment normal.      CMP ( most recent) CMP     Component Value Date/Time   NA 136 08/24/2021 1348   K 4.0 08/24/2021 1348   CL 102 08/24/2021 1348   CO2 28 08/24/2021 1348   GLUCOSE 242 (H) 08/24/2021 1348   BUN 13 08/24/2021 1348   CREATININE 0.93 08/24/2021 1348   CALCIUM 9.3 08/24/2021 1348   PROT 6.6 06/02/2019 0331   ALBUMIN 3.7 06/02/2019 0331   AST 15 06/02/2019 0331   ALT 28 06/02/2019 0331   ALKPHOS 91 06/02/2019 0331   BILITOT 0.6 06/02/2019 0331   GFRNONAA >60 08/24/2021 1348   GFRAA >60 01/08/2020 1332     Diabetic Labs (most recent): Lab Results  Component Value Date   HGBA1C 7.2 (H) 01/08/2020   HGBA1C 8.3 (H) 10/12/2019   HGBA1C 10.4 (H) 06/14/2019     Lipid Panel ( most recent) Lipid Panel     Component Value Date/Time   CHOL 153 06/14/2019 0900   TRIG 147 06/14/2019 0900   HDL 47 06/14/2019 0900   CHOLHDL 3.3 06/14/2019 0900   VLDL 29 06/14/2019 0900   LDLCALC 77 06/14/2019 0900      Lab Results  Component Value Date   TSH 2.404 06/14/2019      Assessment & Plan:   1. DM type 2 causing vascular disease ([place])    - Seward Grater has currently uncontrolled symptomatic type 2 DM since  45 years of age.  [place] did not make any significant changes in his diet yet.  He presents with significant glycemic burden consistent with his recent A1c of 12.3%.  No hypoglycemia.  Average blood glucose of 250-300 mg/day.     Recent labs reviewed. - I had a long discussion with him about the progressive nature of diabetes and the pathology behind its complications. -his diabetes is complicated by coronary artery disease, CVA, obesity, sedentary life, smoking, mood disorder including depression and he remains at a high risk for more acute and chronic complications which include CAD, CVA, CKD, retinopathy, and neuropathy. These are all discussed in detail with him.  - I  have counseled him on diet  and weight management  by adopting a carbohydrate restricted/protein rich diet. Patient is encouraged to switch to  unprocessed or minimally processed     complex starch and increased protein intake (animal or plant source), fruits, and vegetables. -  he is advised to stick to a routine mealtimes to eat 3 meals  a day and avoid unnecessary snacks ( to snack only to correct hypoglycemia).  - he acknowledges that there is a room for improvement in his food and drink choices. - Suggestion is made for him to avoid simple carbohydrates  from his diet including Cakes, Sweet Desserts, Ice Cream, Soda (diet and regular), Sweet Tea, Candies, Chips, Cookies, Store Bought Juices, Alcohol , Artificial Sweeteners,  Coffee Creamer, and "Sugar-free" Products, Lemonade. This will help patient to have more stable blood glucose profile and potentially avoid unintended weight gain.  The following Lifestyle Medicine recommendations according to [place]  [facility]) were discussed and and offered to patient and he  agrees to start the journey:  A. Whole Foods, Plant-Based Nutrition comprising of fruits and vegetables, plant-based proteins, whole-grain carbohydrates was discussed in detail with the patient.  A list for source of those nutrients were also provided to the patient.  Patient will use only water or unsweetened tea for hydration. B.  The need to stay away from risky substances including alcohol, smoking; obtaining 7 to 9 hours of restorative sleep, at least 150 minutes of moderate intensity exercise weekly, the importance of healthy social connections,  and stress management techniques were discussed. C.  A full color page of  Calorie density of various food groups per pound showing examples of each food groups was provided to the patient.    - he will be scheduled with Norm Salt, RDN, CDE for diabetes education.  - I have approached him with the  following individualized plan to manage  his diabetes and patient agrees:   -In light of his presentation with chronic, severe glycemic burden, he will need insulin treatment to control his diabetes to Target .    He will benefit from early initiation of insulin treatment and he agrees. I discussed and initiated Tresiba 20 units nightly associated with monitoring of blood glucose at least twice a day-daily before breakfast and bedtime.  He will also benefit from a CGM.  I discussed and prescribed the freestyle libre device for him.  He is encouraged to call clinic for glycemic profile less than 70 or greater than 200 mg per DL x3.  Reportedly, he cannot swallow glipizide.  This will be discontinued.  He is advised to continue metformin 1000 mg p.o. twice daily. - he will be considered for incretin therapy as appropriate next visit.  - Specific targets for  A1c;  LDL, HDL,  and Triglycerides were discussed with the patient.  2) Blood Pressure /Hypertension:  his blood pressure is  controlled to target.  He is on metoprolol 100 mg p.o. daily at breakfast, advised to continue.  3) Lipids/Hyperlipidemia: He does not have recent lipid panel to review.  He is not on statins.  He will have fasting lipid panel on subsequent visits.   4)  Weight/Diet:  Body mass index is 35.7 kg/m.  -   clearly complicating his diabetes care.   he is  a candidate for weight loss. I discussed with him the fact that loss of 5 - 10% of his  current body weight will have the most impact on his diabetes management.   Plant Predominant , Whole Foods- Lifestyle Nutrition is discussed and recommended to the patient. Optimal Exercise, Restorative Sleep  information was detailed on discharge instructions.  5) Chronic Care/Health Maintenance:  -he  is not  on ACEI/ARB and Statin medications and  is encouraged to initiate and continue to follow up with Ophthalmology, Dentist,  Podiatrist at least yearly or according to  recommendations, and advised to  quit smoking. I have recommended yearly flu vaccine and pneumonia vaccine at least every 5 years; moderate intensity exercise for up to 150 minutes weekly; and  sleep for 7- 9 hours a day.  Patient is accompanied by his significant other as well as grandchild to the clinic.  The patient was counseled on the dangers of tobacco use, and was advised to quit.  Reviewed strategies to maximize success, including removing cigarettes and smoking materials from environment.   - he is  advised to maintain close follow up with Waldon Reining, MD for primary care needs, as well as his other providers for optimal and coordinated care.    I spent 41 minutes in the care of the patient today including review of labs from CMP,  Lipids, Thyroid Function, Hematology (current and previous including abstractions from other facilities); face-to-face time discussing  his blood glucose readings/logs, discussing hypoglycemia and hyperglycemia episodes and symptoms, medications doses, his options of short and long term treatment based on the latest standards of care / guidelines;  discussion about incorporating lifestyle medicine;  and documenting the encounter.    Please refer to Patient Instructions for Blood Glucose Monitoring and Insulin/Medications Dosing Guide"  in media tab for additional information. Please  also refer to " Patient Self Inventory" in the Media  tab for reviewed elements of pertinent patient history.  Seward Grater participated in the discussions, expressed understanding, and voiced agreement with the above plans.  All questions were answered to his satisfaction. he is encouraged to contact clinic should he have any questions or concerns prior to his return visit.  Follow up plan: - Return in about 9 weeks (around 10/26/2021) for Bring Meter and Logs- A1c in Office.  Glade Lloyd, MD [facility] [address] [place], [place] [address] Phone: [phone]  Fax: [phone]    08/24/2021, 6:00 PM  This note was partially dictated with voice recognition software. Similar sounding words can be transcribed inadequately or may not  be corrected upon review.

## 2021-08-25 ENCOUNTER — Encounter: Payer: Self-pay | Admitting: *Deleted

## 2021-08-25 ENCOUNTER — Telehealth: Payer: Self-pay | Admitting: *Deleted

## 2021-08-25 ENCOUNTER — Ambulatory Visit (HOSPITAL_COMMUNITY)
Admission: RE | Admit: 2021-08-25 | Discharge: 2021-08-25 | Disposition: A | Discharge status: Home / Self Care | Payer: 59 | Source: Ambulatory Visit | Attending: Cardiology | Admitting: Cardiology

## 2021-08-25 ENCOUNTER — Encounter (HOSPITAL_COMMUNITY): Payer: Self-pay

## 2021-08-25 DIAGNOSIS — R079 Chest pain, unspecified: Secondary | ICD-10-CM

## 2021-08-25 MED ORDER — DILTIAZEM HCL 25 MG/5ML IV SOLN
5.0000 mg | INTRAVENOUS | Status: DC | PRN
Start: 2021-08-25 — End: 2021-08-26
  Administered 2021-08-25: 10 mg via INTRAVENOUS

## 2021-08-25 MED ORDER — METOPROLOL TARTRATE 5 MG/5ML IV SOLN
5.0000 mg | INTRAVENOUS | Status: DC | PRN
Start: 2021-08-25 — End: 2021-08-26
  Administered 2021-08-25: 10 mg via INTRAVENOUS

## 2021-08-25 MED ORDER — METOPROLOL TARTRATE 5 MG/5ML IV SOLN
INTRAVENOUS | Status: AC
  Filled 2021-08-25: qty 20

## 2021-08-25 MED ORDER — NITROGLYCERIN 0.4 MG SL SUBL: 0.8000 mg | SUBLINGUAL_TABLET | Freq: Once | SUBLINGUAL | Status: DC

## 2021-08-25 MED ORDER — DILTIAZEM HCL 25 MG/5ML IV SOLN
INTRAVENOUS | Status: AC
  Administered 2021-08-25: 10 mg via INTRAVENOUS
  Filled 2021-08-25: qty 5

## 2021-08-25 NOTE — Progress Notes (Signed)
Pt's heart rate remains 79-81 even with PRN 20mg  IV diltiazem and 10 mg IV metoprolol given, without any decrease with a breath hold. Dr. notified. Per MD, abort CT at this time. CT navigator informed. Pt updated of current plan and pt verbalized understanding. IV removed and pt discharged.

## 2021-08-25 NOTE — Telephone Encounter (Signed)
Lesle Chris, LPN  [id number]/20/9470  5:16 PM EST Back to Top    Patient notified via letter.  Copy to pcp.

## 2021-08-25 NOTE — Telephone Encounter (Signed)
-----   Message from Antoine Poche, MD sent at 08/25/2021  3:10 PM EST ----- Normal labs   Dominga Ferry MD

## 2021-09-09 ENCOUNTER — Encounter: Payer: Self-pay | Admitting: Nutrition

## 2021-09-09 ENCOUNTER — Telehealth: Payer: Self-pay | Admitting: Nutrition

## 2021-09-09 DIAGNOSIS — I10 Essential (primary) hypertension: Secondary | ICD-10-CM

## 2021-09-09 DIAGNOSIS — E782 Mixed hyperlipidemia: Secondary | ICD-10-CM

## 2021-09-09 DIAGNOSIS — E1159 Type 2 diabetes mellitus with other circulatory complications: Secondary | ICD-10-CM

## 2021-09-09 NOTE — Patient Instructions (Signed)
Goals Established by Pt Eat three meals per day at times discussed. Drink only water Increase fresh fruits and vegetables and more plant based foods Walk 30 minutes a day Cut out processed foods Test Blood sugars 4 times per day
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
## 2021-09-29 ENCOUNTER — Encounter: Payer: Self-pay | Admitting: *Deleted

## 2021-09-29 ENCOUNTER — Other Ambulatory Visit: Payer: Self-pay | Admitting: *Deleted

## 2021-09-29 DIAGNOSIS — R079 Chest pain, unspecified: Secondary | ICD-10-CM

## 2021-09-29 NOTE — Addendum Note (Signed)
Encounter addended by: Lesle Chris, LPN on: 0/25/4270 4:51 PM  Actions taken: Clinical Note Signed

## 2021-09-29 NOTE — Progress Notes (Signed)
Patient agrees to doing [place].  Order entered & sent to [facility] for scheduling.  Instructions also given.

## 2021-10-01 ENCOUNTER — Encounter: Payer: Self-pay | Admitting: *Deleted

## 2021-10-05 ENCOUNTER — Encounter (HOSPITAL_COMMUNITY): Payer: Self-pay

## 2021-10-05 ENCOUNTER — Other Ambulatory Visit: Payer: Self-pay

## 2021-10-05 ENCOUNTER — Ambulatory Visit (HOSPITAL_COMMUNITY)
Admission: RE | Admit: 2021-10-05 | Discharge: 2021-10-05 | Disposition: A | Discharge status: Home / Self Care | Payer: Self-pay | Source: Ambulatory Visit | Attending: Cardiology | Admitting: Cardiology

## 2021-10-05 ENCOUNTER — Encounter (HOSPITAL_COMMUNITY)
Admission: RE | Admit: 2021-10-05 | Discharge: 2021-10-05 | Disposition: A | Discharge status: Home / Self Care | Payer: Self-pay | Source: Ambulatory Visit | Attending: Cardiology | Admitting: Cardiology

## 2021-10-05 DIAGNOSIS — R079 Chest pain, unspecified: Secondary | ICD-10-CM

## 2021-10-05 LAB — NM MYOCAR MULTI W/SPECT W/WALL MOTION / EF
Base ST Depression (mm): 0 mm
LV dias vol: 124 mL (ref 62–150)
LV sys vol: 68 mL
Nuc Stress EF: 45 %
Peak HR: 121 {beats}/min
RATE: 0.3
Rest HR: 89 {beats}/min
Rest Nuclear Isotope Dose: 10.7 mCi
SDS: 1
SRS: 4
SSS: 4
ST Depression (mm): 0 mm
Stress Nuclear Isotope Dose: 31 mCi
TID: 1

## 2021-10-05 MED ORDER — SODIUM CHLORIDE FLUSH 0.9 % IV SOLN
INTRAVENOUS | Status: AC
  Administered 2021-10-05: 10 mL via INTRAVENOUS
  Filled 2021-10-05: qty 10

## 2021-10-05 MED ORDER — TECHNETIUM TC 99M TETROFOSMIN IV KIT
10.0000 | PACK | Freq: Once | INTRAVENOUS | Status: AC | PRN
  Administered 2021-10-05: 10.7 via INTRAVENOUS

## 2021-10-05 MED ORDER — REGADENOSON 0.4 MG/5ML IV SOLN
INTRAVENOUS | Status: AC
  Administered 2021-10-05: 0.4 mg via INTRAVENOUS
  Filled 2021-10-05: qty 5

## 2021-10-05 MED ORDER — TECHNETIUM TC 99M TETROFOSMIN IV KIT
30.0000 | PACK | Freq: Once | INTRAVENOUS | Status: AC | PRN
  Administered 2021-10-05: 31 via INTRAVENOUS

## 2021-10-07 ENCOUNTER — Telehealth: Payer: Self-pay | Admitting: *Deleted

## 2021-10-07 NOTE — Telephone Encounter (Signed)
-----   Message from Arnoldo Lenis, MD sent at 10/07/2021 10:27 AM EST ----- Normal stress test, no evidence of blockages. Needs to f/u with pcp to consider noncardiac causes of symptoms. F/u with [place] 4 months  Zandra Abts MD

## 2021-10-07 NOTE — Telephone Encounter (Signed)
Patient informed. Copy sent to PCP °

## 2021-10-26 ENCOUNTER — Telehealth: Payer: Self-pay | Admitting: Nutrition

## 2021-10-26 ENCOUNTER — Ambulatory Visit: Payer: 59 | Admitting: "Endocrinology

## 2021-10-26 ENCOUNTER — Ambulatory Visit: Payer: 59 | Admitting: Nutrition

## 2021-10-26 NOTE — Telephone Encounter (Signed)
TC to patient due to no show. His wife answered phone and said he is sick in bed with a virus. She notes he wasn't able to get insulin due to cost. Doesn't have insurance anymore. Doesn't have a libre, can't afford. She is unsure if he is testing or not. Requested to have him call my office or Dr. Fransico Him office to discuss medications and blood sugars. She verbalized she would let him know. He may quality for medication assistance with [facility] and/or company assistance program.

## 2021-10-28 ENCOUNTER — Ambulatory Visit: Payer: 59 | Admitting: Urology

## 2021-11-10 ENCOUNTER — Telehealth: Payer: Self-pay | Admitting: Nutrition

## 2021-11-10 NOTE — Telephone Encounter (Signed)
TC to patient to inform him that he could reschedule his appt with Dr. Fransico Him and he would consider a cheaper insulin that he can afford. He noted he can't afford the visit since he doesn't have insurance. He has only been taking Metformin. He has an appt with Dr. Dahlia Client on the 14th of March. Advised him to call [facility] and request an appointment with the medication assistance program to get help with his  insulin. He verbalized understanding.    Asked if he would like to reschedule an appointment with me or Dr.  Fransico Him to help him with his diabetes and he said he didn't want to schedule an appt. Informed him that it would not be a cost to him for him to see he. He still declined to schedule an appointment.

## 2021-11-26 ENCOUNTER — Telehealth: Payer: Self-pay | Admitting: "Endocrinology

## 2021-11-26 NOTE — Telephone Encounter (Signed)
Received referral for DM-- pt states he has a lot going on with working and moving and does not want to schedule at this time. Closed referral and informed pt when he is ready to schedule to get his PCP to send a new referral.  ?

## 2022-02-04 ENCOUNTER — Ambulatory Visit: Payer: Self-pay | Admitting: Cardiology

## 2022-02-04 NOTE — Progress Notes (Unsigned)
Clinical Summary Don Owens is a 46 y.o.male  seen today as a new consult for the following medical problems.      1.Chest pain -age 35 episode of chest pain, taken to [facility] - he was told he had "heart attack due to stress". Does not recall the testing that was done     - recent chest pains at times with activity. Occurs few times a week. Can be a pressure, sharp, or dull. Midchest, 6-7/10 in severity. Can have some SOB. Pain lasts a few seconds.  - symptoms started 2.5 years ago, some increased frequency   CAD risk factors: + DM2, hyperlipidemia, +smoker x 30 years, mother had "heart issues" not sure of details.     Jan 2023 nuclear stress: no ischemia, LVEF 45%    Past Medical History:  Diagnosis Date   Diabetes mellitus    dx age 46   Heart attack ([place])    Hyperlipidemia    Hypertension    PTSD (post-traumatic stress disorder)    Sleep apnea    Stroke ([facility])      No Known Allergies   Current Outpatient Medications  Medication Sig Dispense Refill   acetaminophen (TYLENOL) 500 MG tablet Take 500 mg by mouth every 6 (six) hours as needed.     amitriptyline (ELAVIL) 50 MG tablet Take 50 mg by mouth at bedtime.     aspirin EC 81 MG tablet Take 1 tablet (81 mg total) by mouth daily.     buPROPion (WELLBUTRIN SR) 200 MG 12 hr tablet Take 200 mg by mouth 2 (two) times daily.     Continuous Blood Gluc Receiver (FREESTYLE LIBRE 2 READER) DEVI As directed 1 each 0   Continuous Blood Gluc Sensor (FREESTYLE LIBRE 2 SENSOR) MISC 1 Piece by Does not apply route every 14 (fourteen) days. 2 each 3   famotidine (PEPCID) 20 MG tablet Take 20 mg by mouth 2 (two) times daily.     gabapentin (NEURONTIN) 300 MG capsule Take 1 capsule (300 mg total) by mouth 2 (two) times daily. (Patient taking differently: Take 300 mg by mouth 2 (two) times daily. As needed) 30 capsule 0   ibuprofen (ADVIL) 200 MG tablet Take 200 mg by mouth every 6 (six) hours as needed.     insulin  degludec (TRESIBA FLEXTOUCH) 200 UNIT/ML FlexTouch Pen Inject 20 Units into the skin at bedtime. 6 mL 2   Insulin Pen Needle (B-D ULTRAFINE III SHORT PEN) 31G X 8 MM MISC 1 each by Does not apply route as directed. 100 each 3   loratadine (CLARITIN) 10 MG tablet Take 20 mg by mouth daily.     metFORMIN (GLUCOPHAGE) 1000 MG tablet Take 1,000 mg by mouth 2 (two) times daily.     metoprolol tartrate (LOPRESSOR) 100 MG tablet Take 1 tablet (100 mg total) by mouth once for 1 dose. 1 tablet 0   tamsulosin (FLOMAX) 0.4 MG CAPS capsule Take 1 capsule (0.4 mg total) by mouth daily after supper. 30 capsule 11   traZODone (DESYREL) 100 MG tablet Take 100 mg by mouth at bedtime.     No current facility-administered medications for this visit.     No past surgical history on file.   No Known Allergies    Family History  Problem Relation Age of Onset   Kidney disease Mother    Heart disease Mother    Diabetes Mother    Stroke Mother    Stroke Father  Hypertension Father      Social History Don Owens reports that he has been smoking cigarettes. He has been smoking an average of 1 pack per day. He has never used smokeless tobacco. Don Owens reports that he does not currently use alcohol.   Review of Systems CONSTITUTIONAL: No weight loss, fever, chills, weakness or fatigue.  HEENT: Eyes: No visual loss, blurred vision, double vision or yellow sclerae.No hearing loss, sneezing, congestion, runny nose or sore throat.  SKIN: No rash or itching.  CARDIOVASCULAR:  RESPIRATORY: No shortness of breath, cough or sputum.  GASTROINTESTINAL: No anorexia, nausea, vomiting or diarrhea. No abdominal pain or blood.  GENITOURINARY: No burning on urination, no polyuria NEUROLOGICAL: No headache, dizziness, syncope, paralysis, ataxia, numbness or tingling in the extremities. No change in bowel or bladder control.  MUSCULOSKELETAL: No muscle, back pain, joint pain or stiffness.  LYMPHATICS: No enlarged  nodes. No history of splenectomy.  PSYCHIATRIC: No history of depression or anxiety.  ENDOCRINOLOGIC: No reports of sweating, cold or heat intolerance. No polyuria or polydipsia.  Marland Kitchen   Physical Examination There were no vitals filed for this visit. There were no vitals filed for this visit.  Gen: resting comfortably, no acute distress HEENT: no scleral icterus, pupils equal round and reactive, no palptable cervical adenopathy,  CV Resp: Clear to auscultation bilaterally GI: abdomen is soft, non-tender, non-distended, normal bowel sounds, no hepatosplenomegaly MSK: extremities are warm, no edema.  Skin: warm, no rash Neuro:  no focal deficits Psych: appropriate affect   Diagnostic Studies  Jan 2023 nuclear stress  ECG is normal. ECG rhythm shows normal sinus rhythm. Resting ECG shows no ST-segment deviation.   ECG was interpretable and without significant changes. The ECG was not diagnostic due to pharmacologic protocol.   There is no evidence of ischemia. There is no evidence of infarction. Defect 1: There is a small defect with mild reduction in uptake present in the apical apex location(s) that is fixed.   Poor image quality on gated study but overall appears grossly normal. End diastolic cavity size is normal. No evidence of transient ischemic dilation (TID) noted.   Findings are consistent with no prior ischemia. The study is low risk.   Calulated EF was 45% but appears higher visually.  Recommend 2D echo to compare   Assessment and Plan  Chest pain - unclear prior cardiac history. He reports an admission at age 65 at [facility] where he was told he had a "heart attack due to stress", does not recall testing that was done. I am not sure what this means exactly, but requesting records.  - recent exertinoal chest pains. Long smoking history with poorly controlled DM2. Not able to run on treadmill due to neuropathy, patient prefers to avoid nuclear tracer, plan for coronary  CTA - EKG today shows SR, RAD        Arnoldo Lenis, M.D., F.A.C.C.

## 2022-03-05 ENCOUNTER — Other Ambulatory Visit: Payer: Self-pay

## 2022-03-05 ENCOUNTER — Encounter (HOSPITAL_COMMUNITY): Payer: Self-pay

## 2022-03-05 ENCOUNTER — Emergency Department (HOSPITAL_COMMUNITY)
Admission: EM | Admit: 2022-03-05 | Discharge: 2022-03-05 | Disposition: A | Discharge status: Home / Self Care | Payer: Self-pay | Attending: Emergency Medicine | Admitting: Emergency Medicine

## 2022-03-05 DIAGNOSIS — I10 Essential (primary) hypertension: Secondary | ICD-10-CM | POA: Insufficient documentation

## 2022-03-05 DIAGNOSIS — E119 Type 2 diabetes mellitus without complications: Secondary | ICD-10-CM | POA: Insufficient documentation

## 2022-03-05 DIAGNOSIS — F17210 Nicotine dependence, cigarettes, uncomplicated: Secondary | ICD-10-CM | POA: Insufficient documentation

## 2022-03-05 DIAGNOSIS — K0889 Other specified disorders of teeth and supporting structures: Secondary | ICD-10-CM | POA: Insufficient documentation

## 2022-03-05 MED ORDER — AMOXICILLIN 500 MG PO CAPS: 500.0000 mg | ORAL_CAPSULE | Freq: Three times a day (TID) | ORAL | 0 refills | Status: AC

## 2022-03-05 MED ORDER — BUPIVACAINE HCL (PF) 0.5 % IJ SOLN
10.0000 mL | Freq: Once | INTRAMUSCULAR | Status: AC
  Administered 2022-03-05: 10 mL
  Filled 2022-03-05: qty 30

## 2022-03-05 MED ORDER — AMOXICILLIN 250 MG PO CAPS
500.0000 mg | ORAL_CAPSULE | Freq: Once | ORAL | Status: AC
  Administered 2022-03-05: 500 mg via ORAL
  Filled 2022-03-05: qty 2

## 2022-04-19 ENCOUNTER — Emergency Department (HOSPITAL_COMMUNITY)
Admission: EM | Admit: 2022-04-19 | Discharge: 2022-04-19 | Discharge status: Against Medical Advice | Payer: Self-pay | Attending: Emergency Medicine | Admitting: Emergency Medicine

## 2022-04-19 ENCOUNTER — Other Ambulatory Visit: Payer: Self-pay

## 2022-04-19 ENCOUNTER — Encounter (HOSPITAL_COMMUNITY): Payer: Self-pay

## 2022-04-19 DIAGNOSIS — Z5321 Procedure and treatment not carried out due to patient leaving prior to being seen by health care provider: Secondary | ICD-10-CM | POA: Insufficient documentation

## 2022-04-19 DIAGNOSIS — M79605 Pain in left leg: Secondary | ICD-10-CM | POA: Insufficient documentation

## 2022-04-19 NOTE — ED Triage Notes (Signed)
Left calf pain x2 weeks. Pt states it feels like a muscle spasm. Pt denies abnormal movement or injury to leg.

## 2022-04-19 NOTE — ED Provider Notes (Signed)
Pt had left when I went into the room.   Derwood Kaplan, MD 04/19/22 2220

## 2022-04-19 NOTE — ED Notes (Signed)
Patient walked out without being seen.

## 2022-08-17 ENCOUNTER — Emergency Department (HOSPITAL_COMMUNITY)
Admission: EM | Admit: 2022-08-17 | Discharge: 2022-08-17 | Disposition: A | Discharge status: Home / Self Care | Payer: Self-pay | Attending: Emergency Medicine | Admitting: Emergency Medicine

## 2022-08-17 ENCOUNTER — Emergency Department (HOSPITAL_COMMUNITY): Payer: Self-pay

## 2022-08-17 ENCOUNTER — Encounter (HOSPITAL_COMMUNITY): Payer: Self-pay | Admitting: Emergency Medicine

## 2022-08-17 ENCOUNTER — Other Ambulatory Visit: Payer: Self-pay

## 2022-08-17 DIAGNOSIS — Z7984 Long term (current) use of oral hypoglycemic drugs: Secondary | ICD-10-CM | POA: Insufficient documentation

## 2022-08-17 DIAGNOSIS — R739 Hyperglycemia, unspecified: Secondary | ICD-10-CM

## 2022-08-17 DIAGNOSIS — F17200 Nicotine dependence, unspecified, uncomplicated: Secondary | ICD-10-CM | POA: Insufficient documentation

## 2022-08-17 DIAGNOSIS — Z7982 Long term (current) use of aspirin: Secondary | ICD-10-CM | POA: Insufficient documentation

## 2022-08-17 DIAGNOSIS — E1165 Type 2 diabetes mellitus with hyperglycemia: Secondary | ICD-10-CM | POA: Insufficient documentation

## 2022-08-17 DIAGNOSIS — J019 Acute sinusitis, unspecified: Secondary | ICD-10-CM | POA: Insufficient documentation

## 2022-08-17 DIAGNOSIS — Z794 Long term (current) use of insulin: Secondary | ICD-10-CM | POA: Insufficient documentation

## 2022-08-17 DIAGNOSIS — R000 Tachycardia, unspecified: Secondary | ICD-10-CM | POA: Insufficient documentation

## 2022-08-17 DIAGNOSIS — Z720 Tobacco use: Secondary | ICD-10-CM

## 2022-08-17 DIAGNOSIS — Z79899 Other long term (current) drug therapy: Secondary | ICD-10-CM | POA: Insufficient documentation

## 2022-08-17 DIAGNOSIS — D72829 Elevated white blood cell count, unspecified: Secondary | ICD-10-CM | POA: Insufficient documentation

## 2022-08-17 DIAGNOSIS — Z20822 Contact with and (suspected) exposure to covid-19: Secondary | ICD-10-CM | POA: Insufficient documentation

## 2022-08-17 LAB — CBC WITH DIFFERENTIAL/PLATELET
Abs Immature Granulocytes: 0.07 10*3/uL (ref 0.00–0.07)
Basophils Absolute: 0.1 10*3/uL (ref 0.0–0.1)
Basophils Relative: 1 %
Eosinophils Absolute: 0.1 10*3/uL (ref 0.0–0.5)
Eosinophils Relative: 1 %
HCT: 44.8 % (ref 39.0–52.0)
Hemoglobin: 15 g/dL (ref 13.0–17.0)
Immature Granulocytes: 1 %
Lymphocytes Relative: 24 %
Lymphs Abs: 3 10*3/uL (ref 0.7–4.0)
MCH: 29.8 pg (ref 26.0–34.0)
MCHC: 33.5 g/dL (ref 30.0–36.0)
MCV: 89.1 fL (ref 80.0–100.0)
Monocytes Absolute: 0.9 10*3/uL (ref 0.1–1.0)
Monocytes Relative: 7 %
Neutro Abs: 8.3 10*3/uL — ABNORMAL HIGH (ref 1.7–7.7)
Neutrophils Relative %: 66 %
Platelets: 261 10*3/uL (ref 150–400)
RBC: 5.03 MIL/uL (ref 4.22–5.81)
RDW: 12.9 % (ref 11.5–15.5)
WBC: 12.5 10*3/uL — ABNORMAL HIGH (ref 4.0–10.5)
nRBC: 0 % (ref 0.0–0.2)

## 2022-08-17 LAB — URINALYSIS, ROUTINE W REFLEX MICROSCOPIC
Bacteria, UA: NONE SEEN
Bilirubin Urine: NEGATIVE
Glucose, UA: >=500 mg/dL — AB
Hgb urine dipstick: NEGATIVE
Ketones, ur: NEGATIVE mg/dL
Leukocytes,Ua: NEGATIVE
Nitrite: NEGATIVE
Protein, ur: NEGATIVE mg/dL
Specific Gravity, Urine: 1.033 — ABNORMAL HIGH (ref 1.005–1.030)
pH: 6 (ref 5.0–8.0)

## 2022-08-17 LAB — COMPREHENSIVE METABOLIC PANEL
ALT: 25 U/L (ref 0–44)
AST: 17 U/L (ref 15–41)
Albumin: 3.7 g/dL (ref 3.5–5.0)
Alkaline Phosphatase: 137 U/L — ABNORMAL HIGH (ref 38–126)
Anion gap: 9 (ref 5–15)
BUN: 13 mg/dL (ref 6–20)
CO2: 25 mmol/L (ref 22–32)
Calcium: 8.9 mg/dL (ref 8.9–10.3)
Chloride: 106 mmol/L (ref 98–111)
Creatinine, Ser: 0.91 mg/dL (ref 0.61–1.24)
GFR, Estimated: >60 mL/min (ref >60)
Glucose, Bld: 344 mg/dL — ABNORMAL HIGH (ref 70–99)
Potassium: 4.1 mmol/L (ref 3.5–5.1)
Sodium: 140 mmol/L (ref 135–145)
Total Bilirubin: 0.4 mg/dL (ref 0.3–1.2)
Total Protein: 7.4 g/dL (ref 6.5–8.1)

## 2022-08-17 LAB — RESP PANEL BY RT-PCR (FLU A&B, COVID) ARPGX2
Influenza A by PCR: NEGATIVE
Influenza B by PCR: NEGATIVE
SARS Coronavirus 2 by RT PCR: NEGATIVE

## 2022-08-17 LAB — GROUP A STREP BY PCR: Group A Strep by PCR: NOT DETECTED

## 2022-08-17 LAB — CBG MONITORING, ED: Glucose-Capillary: 395 mg/dL — ABNORMAL HIGH (ref 70–99)

## 2022-08-17 LAB — MAGNESIUM: Magnesium: 2.3 mg/dL (ref 1.7–2.4)

## 2022-08-17 MED ORDER — GLYBURIDE 5 MG PO TABS: 5.0000 mg | ORAL_TABLET | Freq: Two times a day (BID) | ORAL | 0 refills | Status: DC

## 2022-08-17 MED ORDER — METFORMIN HCL 500 MG PO TABS: 500.0000 mg | ORAL_TABLET | Freq: Two times a day (BID) | ORAL | 0 refills | Status: DC

## 2022-08-17 MED ORDER — AMOXICILLIN-POT CLAVULANATE 875-125 MG PO TABS: 1.0000 | ORAL_TABLET | Freq: Two times a day (BID) | ORAL | 0 refills | Status: DC

## 2022-08-17 MED ORDER — AMOXICILLIN-POT CLAVULANATE 875-125 MG PO TABS
1.0000 | ORAL_TABLET | Freq: Once | ORAL | Status: AC
  Administered 2022-08-17: 1 via ORAL
  Filled 2022-08-17: qty 1

## 2022-08-17 MED ORDER — SODIUM CHLORIDE 0.9 % IV BOLUS
1000.0000 mL | Freq: Once | INTRAVENOUS | Status: AC
  Administered 2022-08-17: 1000 mL via INTRAVENOUS

## 2022-08-17 MED ORDER — INSULIN ASPART 100 UNIT/ML IJ SOLN
5.0000 [IU] | Freq: Once | INTRAMUSCULAR | Status: AC
  Administered 2022-08-17: 5 [IU] via SUBCUTANEOUS
  Filled 2022-08-17: qty 1

## 2022-08-17 NOTE — ED Provider Notes (Signed)
[facility] EMERGENCY DEPARTMENT Provider Note   CSN: [id number] Arrival date & time: 08/17/22  1638     History  No chief complaint on file.   Don Owens is a 46 y.o. male.  HPI   This patient is a 46 year old with a history of diabetes, the patient states that he has not had insulin in the better part of 6 months, he has run out of insulin and states he cannot afford it without insurance.  He has also stopped taking his metformin about a week ago as he is convinced it does not help but it makes him have an upset stomach.  He reports that he has had some increased urinary frequency, he is getting lightheaded when he stands but primarily his visit is for about a month of nasal congestion drainage coughing subjective fevers and chills nausea and lightheadedness.  He feels like he has a sinus infection that continues to get worse.  Home Medications Prior to Admission medications   Medication Sig Start Date End Date Taking? Authorizing Provider  amoxicillin-clavulanate (AUGMENTIN) 875-125 MG tablet Take 1 tablet by mouth every 12 (twelve) hours. 08/17/22  Yes Eber Hong, MD  glyBURIDE (DIABETA) 5 MG tablet Take 1 tablet (5 mg total) by mouth 2 (two) times daily with a meal. 08/17/22  Yes Eber Hong, MD  metFORMIN (GLUCOPHAGE) 500 MG tablet Take 1 tablet (500 mg total) by mouth 2 (two) times daily with a meal. 08/17/22 09/16/22 Yes Eber Hong, MD  acetaminophen (TYLENOL) 500 MG tablet Take 500 mg by mouth every 6 (six) hours as needed.    [provider]  amitriptyline (ELAVIL) 50 MG tablet Take 50 mg by mouth at bedtime. 05/13/21   [provider]  aspirin EC 81 MG tablet Take 1 tablet (81 mg total) by mouth daily. 06/13/19   Jacquelin Hawking, PA-C  buPROPion (WELLBUTRIN SR) 200 MG 12 hr tablet Take 200 mg by mouth 2 (two) times daily. 05/13/21   [provider]  Continuous Blood Gluc Receiver (FREESTYLE LIBRE 2 READER) DEVI As directed 08/24/21   Roma Kayser, MD  Continuous Blood Gluc Sensor (FREESTYLE LIBRE 2 SENSOR) MISC 1 Piece by Does not apply route every 14 (fourteen) days. 08/24/21   Roma Kayser, MD  famotidine (PEPCID) 20 MG tablet Take 20 mg by mouth 2 (two) times daily. 05/13/21   [provider]  gabapentin (NEURONTIN) 300 MG capsule Take 1 capsule (300 mg total) by mouth 2 (two) times daily. Patient taking differently: Take 300 mg by mouth 2 (two) times daily. As needed 11/12/20   Triplett, Tammy, PA-C  ibuprofen (ADVIL) 200 MG tablet Take 200 mg by mouth every 6 (six) hours as needed.    [provider]  insulin degludec (TRESIBA FLEXTOUCH) 200 UNIT/ML FlexTouch Pen Inject 20 Units into the skin at bedtime. 08/24/21   Roma Kayser, MD  Insulin Pen Needle (B-D ULTRAFINE III SHORT PEN) 31G X 8 MM MISC 1 each by Does not apply route as directed. 08/24/21   Roma Kayser, MD  loratadine (CLARITIN) 10 MG tablet Take 20 mg by mouth daily.    [provider]  metoprolol tartrate (LOPRESSOR) 100 MG tablet Take 1 tablet (100 mg total) by mouth once for 1 dose. 07/16/21 07/16/21  Antoine Poche, MD  tamsulosin (FLOMAX) 0.4 MG CAPS capsule Take 1 capsule (0.4 mg total) by mouth daily after supper. 06/08/21   McKenzie, Mardene Celeste, MD  traZODone (DESYREL) 100  MG tablet Take 100 mg by mouth at bedtime.    [provider]      Allergies    Patient has no known allergies.    Review of Systems   Review of Systems  All other systems reviewed and are negative.   Physical Exam Updated Vital Signs BP (!) 120/100   Pulse 88   Temp 98.4 F (36.9 C) (Oral)   Resp 16   SpO2 94%  Physical Exam Vitals and nursing note reviewed.  Constitutional:      General: He is not in acute distress.    Appearance: He is well-developed.  HENT:     Head: Normocephalic and atraumatic.     Mouth/Throat:     Mouth: Mucous membranes are dry.     Pharynx: No oropharyngeal exudate.  Eyes:      General: No scleral icterus.       Right eye: No discharge.        Left eye: No discharge.     Conjunctiva/sclera: Conjunctivae normal.     Pupils: Pupils are equal, round, and reactive to light.  Neck:     Thyroid: No thyromegaly.     Vascular: No carotid bruit or JVD.  Cardiovascular:     Rate and Rhythm: Regular rhythm. Tachycardia present.     Heart sounds: Normal heart sounds. No murmur heard.    No friction rub. No gallop.  Pulmonary:     Effort: Pulmonary effort is normal. No respiratory distress.     Breath sounds: Normal breath sounds. No wheezing or rales.  Abdominal:     General: Bowel sounds are normal. There is no distension.     Palpations: Abdomen is soft. There is no mass.     Tenderness: There is no abdominal tenderness.  Musculoskeletal:        General: No tenderness. Normal range of motion.     Cervical back: Normal range of motion and neck supple. No rigidity or tenderness.     Right lower leg: No edema.     Left lower leg: No edema.  Lymphadenopathy:     Cervical: No cervical adenopathy.  Skin:    General: Skin is warm and dry.     Findings: No erythema or rash.  Neurological:     Mental Status: He is alert.     Coordination: Coordination normal.  Psychiatric:        Behavior: Behavior normal.     ED Results / Procedures / Treatments   Labs (all labs ordered are listed, but only abnormal results are displayed) Labs Reviewed  CBC WITH DIFFERENTIAL/PLATELET - Abnormal; Notable for the following components:      Result Value   WBC 12.5 (*)    Neutro Abs 8.3 (*)    All other components within normal limits  COMPREHENSIVE METABOLIC PANEL - Abnormal; Notable for the following components:   Glucose, Bld 344 (*)    Alkaline Phosphatase 137 (*)    All other components within normal limits  URINALYSIS, ROUTINE W REFLEX MICROSCOPIC - Abnormal; Notable for the following components:   Specific Gravity, Urine 1.033 (*)    Glucose, UA >=500 (*)    All  other components within normal limits  CBG MONITORING, ED - Abnormal; Notable for the following components:   Glucose-Capillary 395 (*)    All other components within normal limits  GROUP A STREP BY PCR  RESP PANEL BY RT-PCR (FLU A&B, COVID) [id number]  MAGNESIUM    EKG None  Radiology DG Chest 2 View  Result Date: 08/17/2022 CLINICAL DATA:  Cough and fever. EXAM: CHEST - 2 VIEW COMPARISON:  AP chest 09/02/2020 FINDINGS: Cardiac silhouette and mediastinal contours are within normal limits. Mild right midlung and left lower lung horizontal linear scarring is unchanged from prior. No new focal airspace opacity. No pleural effusion or pneumothorax. Minimal multilevel degenerative disc changes of the thoracic spine. IMPRESSION: Mild right midlung and left lower lung linear scarring is unchanged from prior. No acute lung process. Electronically Signed   By: Neita Garnet M.D.   On: 08/17/2022 17:51    Procedures Procedures    Medications Ordered in ED Medications  sodium chloride 0.9 % bolus 1,000 mL (1,000 mLs Intravenous New Bag/Given 08/17/22 1809)  insulin aspart (novoLOG) injection 5 Units (5 Units Subcutaneous Given 08/17/22 1945)  amoxicillin-clavulanate (AUGMENTIN) 875-125 MG per tablet 1 tablet (1 tablet Oral Given 08/17/22 1944)    ED Course/ Medical Decision Making/ A&P                           Medical Decision Making Amount and/or Complexity of Data Reviewed Labs: ordered. Radiology: ordered.  Risk Prescription drug management.   This patient presents to the ED for concern of symptoms of upper respiratory illness, possible sinusitis, he does feel warm to the touch, his tachycardia is about 104 bpm with normal pulses no JVD and no peripheral edema.  Lungs are clear with the occasional cough, he has what appears to be some decompensated diabetes as he is noncompliant with his medications.  Will need to check for DKA, will also give IV fluids because I suspect he is dehydrated  based on his polyuria lightheadedness with standing and months of noncompliance with his insulin.    Additional history obtained:  Additional history obtained from electronic medical record External records from outside source obtained and reviewed including multiple ED visits for a variety of complaints including left shoulder plain, leg swelling dental pain etc.  It appears that he had a stress test that was performed about 10 or 11 months ago and had been followed by endocrinology diabetes last seen in December 2022.   Lab Tests:  I Ordered, and personally interpreted labs.  The pertinent results include: CBC with mild leukocytosis of 12,500, metabolic panel is unremarkable except for hyperglycemia but no signs of DKA or renal dysfunction   Imaging Studies ordered:  I ordered imaging studies including chest x-ray I independently visualized and interpreted imaging which showed no acute findings, chronic atelectasis or mild scarring seen I agree with the radiologist interpretation   Medicines ordered and prescription drug management:  I ordered medication including Augmentin for sinusitis Reevaluation of the patient after these medicines showed that the patient improved, also gave insulin for hyperglycemia I have reviewed the patients home medicines and have made adjustments as needed   Problem List / ED Course:  I counseled the patient at length regarding his tobacco abuse and the need for compliance with his diabetic medications.  He will be given prescriptions for both metformin and glyburide.  He is agreeable to the plan.   Social Determinants of Health:  Continues to smoke cigarettes          Final Clinical Impression(s) / ED Diagnoses Final diagnoses:  Hyperglycemia  Acute sinusitis, recurrence not specified, unspecified location  Tobacco abuse    Rx / DC Orders ED Discharge Orders  Ordered    amoxicillin-clavulanate (AUGMENTIN) 875-125 MG tablet   Every 12 hours        08/17/22 1939    metFORMIN (GLUCOPHAGE) 500 MG tablet  2 times daily with meals        08/17/22 1939    glyBURIDE (DIABETA) 5 MG tablet  2 times daily with meals        08/17/22 1939              Eber Hong, MD 08/17/22 1946

## 2022-08-17 NOTE — ED Triage Notes (Signed)
Pt states "I have a sinus infection for a month" c/o sore throat, cough, runny nose. Has not tested for strep or covid. Nad. No resp distress noted.

## 2022-08-17 NOTE — Discharge Instructions (Signed)
Placed on smoking immediate   Please take Augmentin, 1 tablet twice a day for the next 7 days, this is used to treat infections, it treats a variety of infections including animal bites, bacterial infections, and some gastrointestinal infections.   It is related to amoxicillin so do not take this medication if you are allergic to amoxicillin or penicillin.  effects of medications such as antibiotics include diarrhea which may occur as well as potentially inactivating birth control so if you are using a birth control pill please use an alternative form of birth control for the next 2 weeks.  There is occasions where this antibiotic does not work so if you are not improving within 48 hours you will need to be reevaluated immediately by your doctor or in the emergency department if your symptoms are worsening  I would like for you to start taking the metformin twice a day and the glyburide twice a day, this will help with your diabetes and your blood sugar.  You will need to follow-up with your family doctor for ongoing care and treatment of your chronic diabetic condition.  Thank you for allowing [place] to treat you in the emergency department today.  After reviewing your examination and potential testing that was done it appears that you are safe to go home.  I would like for you to follow-up with your doctor within the next several days, have them obtain your results and follow-up with them to review all of these tests.  If you should develop severe or worsening symptoms return to the emergency department immediately

## 2022-08-17 NOTE — ED Notes (Signed)
CBG 395 in triage

## 2022-08-21 IMAGING — DX DG CHEST 1V PORT
1 series · 1 of 1 positions shown · non-contrast
Comparison: February 13, 2020

CLINICAL DATA: Body aches, cough and fatigue.

EXAM:
PORTABLE CHEST 1 VIEW

[chest ap grid]
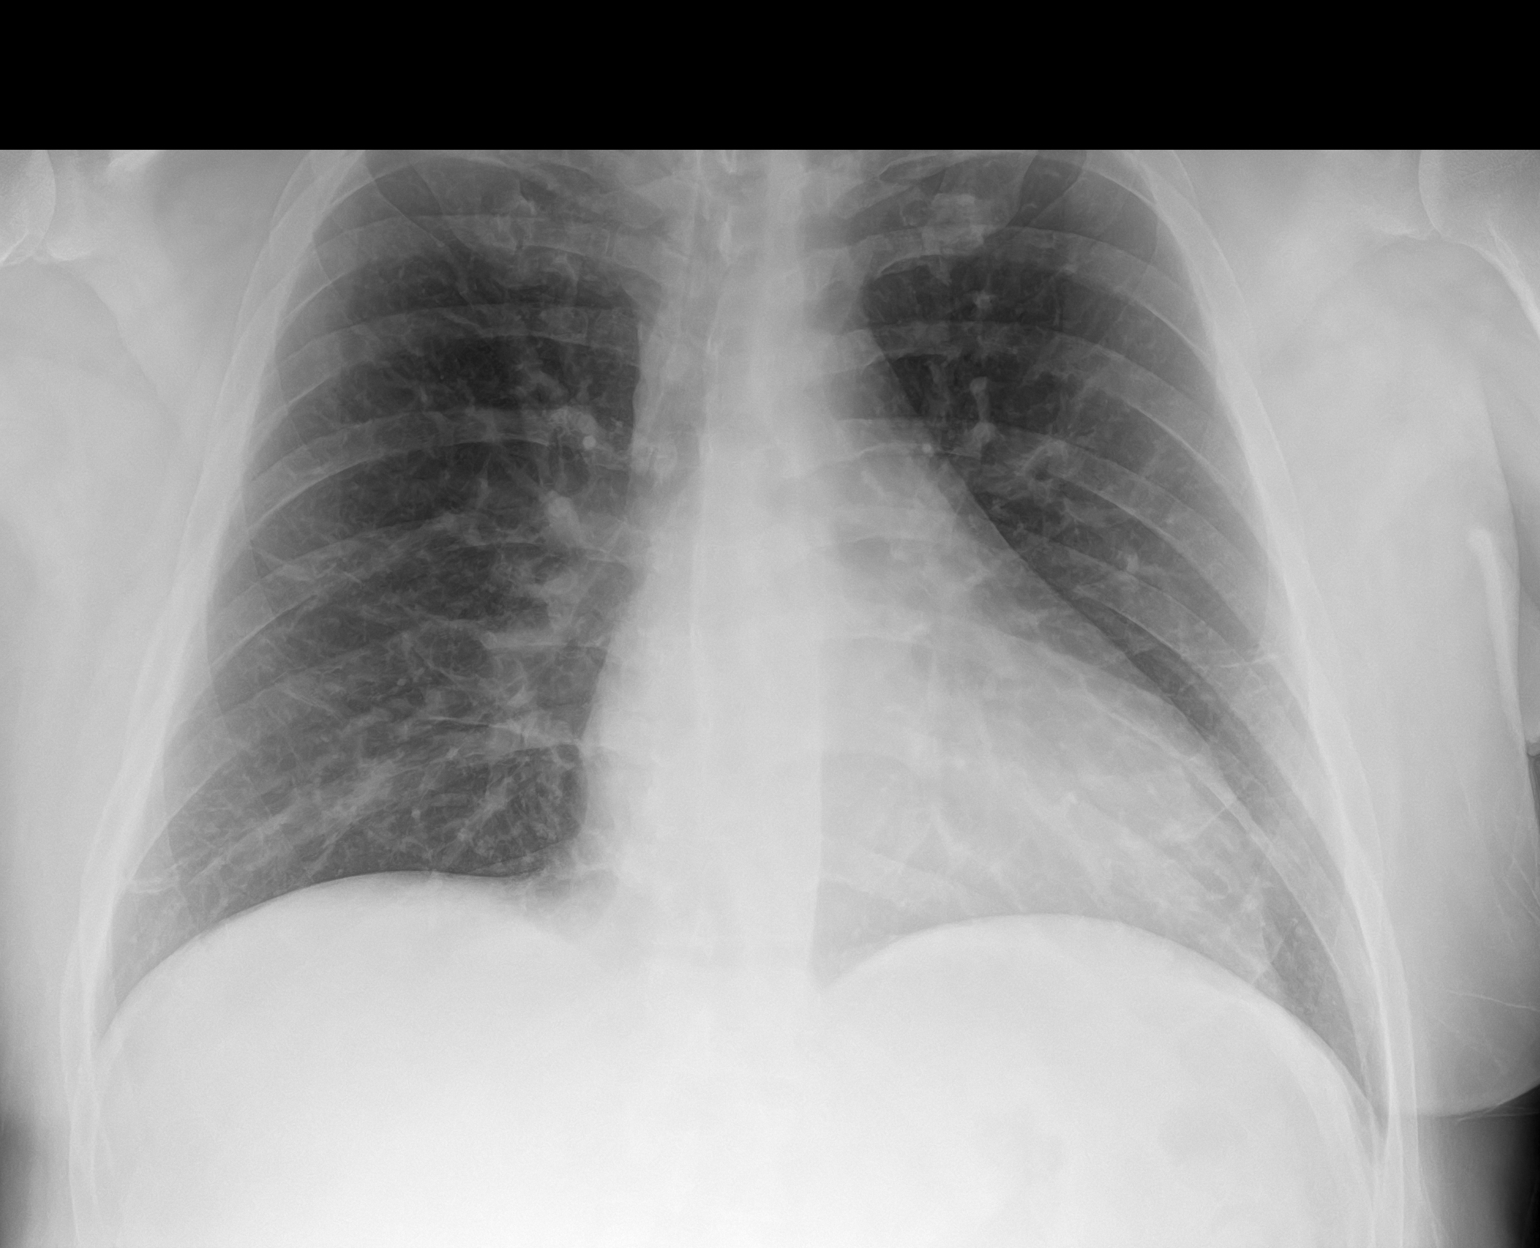

[1 of 1 positions shown; findings below may reference images not displayed]

FINDINGS: Very mild areas of linear atelectasis are seen within the lateral
aspects of the mid lung fields, bilaterally. There is no evidence of
acute infiltrate, pleural effusion or pneumothorax. The heart size
and mediastinal contours are within normal limits. The visualized
skeletal structures are unremarkable.
IMPRESSION: Very mild bilateral linear atelectasis.

## 2022-08-22 ENCOUNTER — Emergency Department (HOSPITAL_COMMUNITY)
Admission: EM | Admit: 2022-08-22 | Discharge: 2022-08-22 | Disposition: A | Discharge status: Home / Self Care | Payer: Self-pay | Attending: Emergency Medicine | Admitting: Emergency Medicine

## 2022-08-22 ENCOUNTER — Encounter (HOSPITAL_COMMUNITY): Payer: Self-pay | Admitting: *Deleted

## 2022-08-22 ENCOUNTER — Other Ambulatory Visit: Payer: Self-pay

## 2022-08-22 DIAGNOSIS — Z7984 Long term (current) use of oral hypoglycemic drugs: Secondary | ICD-10-CM | POA: Insufficient documentation

## 2022-08-22 DIAGNOSIS — Z7982 Long term (current) use of aspirin: Secondary | ICD-10-CM | POA: Insufficient documentation

## 2022-08-22 DIAGNOSIS — Z794 Long term (current) use of insulin: Secondary | ICD-10-CM | POA: Insufficient documentation

## 2022-08-22 DIAGNOSIS — Z79899 Other long term (current) drug therapy: Secondary | ICD-10-CM | POA: Insufficient documentation

## 2022-08-22 DIAGNOSIS — L309 Dermatitis, unspecified: Secondary | ICD-10-CM | POA: Insufficient documentation

## 2022-08-22 MED ORDER — METHYLPREDNISOLONE SODIUM SUCC 125 MG IJ SOLR
125.0000 mg | Freq: Once | INTRAMUSCULAR | Status: AC
  Administered 2022-08-22: 125 mg via INTRAVENOUS
  Filled 2022-08-22: qty 2

## 2022-08-22 MED ORDER — HYDROXYZINE HCL 25 MG PO TABS: 25.0000 mg | ORAL_TABLET | Freq: Four times a day (QID) | ORAL | 0 refills | Status: DC

## 2022-08-22 MED ORDER — FAMOTIDINE IN NACL 20-0.9 MG/50ML-% IV SOLN
20.0000 mg | Freq: Once | INTRAVENOUS | Status: AC
  Administered 2022-08-22: 20 mg via INTRAVENOUS
  Filled 2022-08-22: qty 50

## 2022-08-22 MED ORDER — PREDNISONE 10 MG PO TABS: 20.0000 mg | ORAL_TABLET | Freq: Every day | ORAL | 0 refills | Status: AC

## 2022-08-22 MED ORDER — DIPHENHYDRAMINE HCL 50 MG/ML IJ SOLN
50.0000 mg | Freq: Once | INTRAMUSCULAR | Status: AC
  Administered 2022-08-22: 50 mg via INTRAVENOUS
  Filled 2022-08-22: qty 1

## 2022-08-22 MED ORDER — SODIUM CHLORIDE 0.9 % IV BOLUS
1000.0000 mL | Freq: Once | INTRAVENOUS | Status: AC
  Administered 2022-08-22: 1000 mL via INTRAVENOUS

## 2022-08-22 NOTE — ED Provider Notes (Signed)
[facility] EMERGENCY DEPARTMENT Provider Note   CSN: [id number] Arrival date & time: 08/22/22  1520     History  Chief Complaint  Patient presents with   Rash    Don Owens is a 46 y.o. male presents to the ED complaining of a rash since last Sunday.  Patient states it started as just a few spots on his arm and he was using hydrocortisone cream and the got better.  He states on Wednesday it "flared up" and has now covered his arms, legs, and torso.  He reports the rash is extremely itchy and also burns.  Denies any changes in soaps, detergents, diet, and cannot recall coming into contact with any new substances.  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  He also reports they do have a new dog at home but that it stays in his step daughter's room.  No other family members have rash.  Denies fever, shortness of breath, chest pain, palpitations, trouble swallowing, skin color changes aside from rash.         Home Medications Prior to Admission medications   Medication Sig Start Date End Date Taking? Authorizing Provider  acetaminophen (TYLENOL) 500 MG tablet Take 500 mg by mouth every 6 (six) hours as needed.    [provider]  amitriptyline (ELAVIL) 50 MG tablet Take 50 mg by mouth at bedtime. 05/13/21   [provider]  amoxicillin-clavulanate (AUGMENTIN) 875-125 MG tablet Take 1 tablet by mouth every 12 (twelve) hours. 08/17/22   Eber Hong, MD  aspirin EC 81 MG tablet Take 1 tablet (81 mg total) by mouth daily. 06/13/19   Jacquelin Hawking, PA-C  buPROPion (WELLBUTRIN SR) 200 MG 12 hr tablet Take 200 mg by mouth 2 (two) times daily. 05/13/21   [provider]  Continuous Blood Gluc Receiver (FREESTYLE LIBRE 2 READER) DEVI As directed 08/24/21   Roma Kayser, MD  Continuous Blood Gluc Sensor (FREESTYLE LIBRE 2 SENSOR) MISC 1 Piece by Does not apply route every 14 (fourteen) days. 08/24/21   Roma Kayser, MD  famotidine (PEPCID) 20 MG tablet  Take 20 mg by mouth 2 (two) times daily. 05/13/21   [provider]  gabapentin (NEURONTIN) 300 MG capsule Take 1 capsule (300 mg total) by mouth 2 (two) times daily. Patient taking differently: Take 300 mg by mouth 2 (two) times daily. As needed 11/12/20   Triplett, Tammy, PA-C  glyBURIDE (DIABETA) 5 MG tablet Take 1 tablet (5 mg total) by mouth 2 (two) times daily with a meal. 08/17/22   Eber Hong, MD  ibuprofen (ADVIL) 200 MG tablet Take 200 mg by mouth every 6 (six) hours as needed.    [provider]  insulin degludec (TRESIBA FLEXTOUCH) 200 UNIT/ML FlexTouch Pen Inject 20 Units into the skin at bedtime. 08/24/21   Roma Kayser, MD  Insulin Pen Needle (B-D ULTRAFINE III SHORT PEN) 31G X 8 MM MISC 1 each by Does not apply route as directed. 08/24/21   Roma Kayser, MD  loratadine (CLARITIN) 10 MG tablet Take 20 mg by mouth daily.    [provider]  metFORMIN (GLUCOPHAGE) 500 MG tablet Take 1 tablet (500 mg total) by mouth 2 (two) times daily with a meal. 08/17/22 09/16/22  Eber Hong, MD  metoprolol tartrate (LOPRESSOR) 100 MG tablet Take 1 tablet (100 mg total) by mouth once for 1 dose. 07/16/21 07/16/21  Antoine Poche, MD  tamsulosin (FLOMAX) 0.4 MG CAPS capsule Take 1 capsule (  0.4 mg total) by mouth daily after supper. 06/08/21   McKenzie, Mardene Celeste, MD  traZODone (DESYREL) 100 MG tablet Take 100 mg by mouth at bedtime.    [provider]      Allergies    Patient has no known allergies.    Review of Systems   Review of Systems  Constitutional:  Negative for fever.  HENT:  Negative for trouble swallowing.   Respiratory:  Negative for shortness of breath.   Cardiovascular:  Negative for chest pain and palpitations.  Skin:  Positive for rash. Negative for color change.    Physical Exam Updated Vital Signs BP (!) 130/95 (BP Location: Right Arm)   Pulse (!) 109   Temp 98.1 F (36.7 C) (Oral)   Resp 15   Ht 6' (1.829 m)   Wt  107 kg   SpO2 95%   BMI 32.01 kg/m  Physical Exam Vitals and nursing note reviewed.  Constitutional:      General: He is not in acute distress.    Appearance: Normal appearance. He is not ill-appearing or diaphoretic.  Cardiovascular:     Rate and Rhythm: Normal rate.  Pulmonary:     Effort: Pulmonary effort is normal.  Skin:    General: Skin is warm and dry.     Capillary Refill: Capillary refill takes less than 2 seconds.     Findings: Erythema and rash present. Rash is papular. Rash is not macular, pustular or vesicular.     Comments: Erythematous papular rash to bilateral upper and lower extremities as well as anterior and posterior torso.  Rash spares face, groin, palms, soles.  Neurological:     Mental Status: He is alert. Mental status is at baseline.  Psychiatric:        Mood and Affect: Mood normal.        Behavior: Behavior normal.     ED Results / Procedures / Treatments   Labs (all labs ordered are listed, but only abnormal results are displayed) Labs Reviewed - No data to display  EKG None  Radiology No results found.  Procedures Procedures    Medications Ordered in ED Medications  methylPREDNISolone sodium succinate (SOLU-MEDROL) 125 mg/2 mL injection 125 mg (125 mg Intravenous Given 08/22/22 1656)  diphenhydrAMINE (BENADRYL) injection 50 mg (50 mg Intravenous Given 08/22/22 1657)  sodium chloride 0.9 % bolus 1,000 mL (0 mLs Intravenous Stopped 08/22/22 1756)  famotidine (PEPCID) IVPB 20 mg premix (0 mg Intravenous Stopped 08/22/22 1735)    ED Course/ Medical Decision Making/ A&P                           Medical Decision Making  This patient presents to the ED with chief complaint(s) of widespread dermatitis that has continued to spread despite over-the-counter treatments.  He reports no changes to his environment other than a new dog in the house.  The differential diagnosis includes atopic dermatitis, contact dermatitis, dyshidrotic  eczema  The initial plan is to treat itching and rash with IV Benadryl, Solu-Medrol, Pepcid, and fluid bolus  Initial Assessment:   On exam, patient has widespread erythematous papular rash to upper extremities, lower extremities, anterior and posterior torso.  There is no evidence of weeping or bleeding from any of the papules.  The face, palms, and feet are spared.  Patient reports his genitals are also spared from the rash.    Independent ECG/labs interpretation:  Based on physical exam and patient  presentation, I do not feel that laboratory workup is warranted at this time.  Independent visualization and interpretation of imaging: Patient presents with what appears to be an uncomplicated dermatitis, I do not feel imaging is warranted at this time.  Treatment and Reassessment: Treated patient with cocktail of Benadryl, Solu-Medrol, Pepcid, and fluid bolus.  Patient reports some mild improvement in itching.  Plan to send patient home on prescription of hydroxyzine and prednisone.  Disposition:   After consideration of the diagnostic results and the patients response to treatment, I feel that emergency department workup does not suggest an emergent condition requiring admission or immediate intervention beyond what has been performed at this time.  The patient is safe for discharge and has been instructed to return immediately for worsening symptoms, change in symptoms or any other concerns.  Sent prescription for hydroxyzine and prednisone to patient's pharmacy to help continue to treat his rash.  Recommended patient wash clothing, sheets, towels and any other washable fabrics he comes into contact with in case there is a substance on the and that may be causing his symptoms.  Also recommended patient check any pets in the home for fleas and check his mattresses for bedbugs.  Advised patient to follow-up with PCP if his symptoms do not improve.          Final Clinical Impression(s) / ED  Diagnoses Final diagnoses:  Dermatitis    Rx / DC Orders ED Discharge Orders     None         Lenard Simmer, PA 08/22/22 1827    Rexford Maus, DO 08/23/22 0023

## 2022-08-22 NOTE — Discharge Instructions (Addendum)
Thank you for allowing me be part of your care today.  You were treated in the ER for dermatitis and given antihistamines and a steroid.  I have sent over a prescription for an antihistamine and a steroid to your pharmacy.  Please pick these up and start taking them whenever you are able to.  I recommend washing all of your clothing, sheets, and towels you come into contact with in case there is a substance on them that could be causing your symptoms.  I also recommend checking any household pets for fleas and checking your mattress for bedbugs.  I recommend following up with your primary care physician if you continue to have this rash.  Return to the ER if you develop any new or worsening signs or symptoms or have any new concerns.

## 2022-08-22 NOTE — ED Provider Triage Note (Signed)
Emergency Medicine Provider Triage Evaluation Note  Don Owens , a 46 y.o. male  was evaluated in triage.  Pt complains of rash all over his body for the last week or so.  States that he has not had any new soaps, done any recent yardwork, change in any medications, or deodorants.  States the rashes all over except for on his feet and groin.  He states that the lesions are itchy, and are becoming painful especially under his armpits.  States that his family members do not have any rash.  Denies any new mattresses..  Review of Systems  Positive: rash Negative: Fever, chills  Physical Exam  BP (!) 130/95 (BP Location: Right Arm)   Pulse (!) 109   Temp 98.1 F (36.7 C) (Oral)   Resp 15   Ht 6' (1.829 m)   Wt 107 kg   SpO2 95%   BMI 32.01 kg/m  Gen:   Awake, no distress   Resp:  Normal effort  MSK:   Moves extremities without difficulty  Other:  Dermatitis to bilateral upper extremities further exam deferred as patient is in triage area  Medical Decision Making  Medically screening exam initiated at 3:57 PM.  Appropriate orders placed.  Don Owens was informed that the remainder of the evaluation will be completed by another provider, this initial triage assessment does not replace that evaluation, and the importance of remaining in the ED until their evaluation is complete.     Pete Pelt, [place] 08/22/22 1558

## 2022-08-22 NOTE — ED Triage Notes (Signed)
Pt with rash since last Sunday, just few spots at that time, pt used hydrocortisone cream and got better.  Wednesday it "flared up" per pt. Has taken benadryl, calamine lotion and hydrocortisone without relief. C/o itching. Denies any changes in soaps, detergents or diet.  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

## 2022-09-23 DIAGNOSIS — K0889 Other specified disorders of teeth and supporting structures: Secondary | ICD-10-CM | POA: Diagnosis not present

## 2022-09-23 DIAGNOSIS — G4700 Insomnia, unspecified: Secondary | ICD-10-CM | POA: Diagnosis not present

## 2022-09-23 DIAGNOSIS — Z7984 Long term (current) use of oral hypoglycemic drugs: Secondary | ICD-10-CM | POA: Diagnosis not present

## 2022-09-23 DIAGNOSIS — E119 Type 2 diabetes mellitus without complications: Secondary | ICD-10-CM | POA: Diagnosis not present

## 2022-09-23 DIAGNOSIS — R69 Illness, unspecified: Secondary | ICD-10-CM | POA: Diagnosis not present

## 2022-09-23 DIAGNOSIS — R6884 Jaw pain: Secondary | ICD-10-CM | POA: Diagnosis not present

## 2022-09-23 DIAGNOSIS — K047 Periapical abscess without sinus: Secondary | ICD-10-CM | POA: Diagnosis not present

## 2022-09-23 DIAGNOSIS — I10 Essential (primary) hypertension: Secondary | ICD-10-CM | POA: Diagnosis not present

## 2022-10-24 DIAGNOSIS — R519 Headache, unspecified: Secondary | ICD-10-CM | POA: Diagnosis not present

## 2022-10-24 DIAGNOSIS — Z79899 Other long term (current) drug therapy: Secondary | ICD-10-CM | POA: Diagnosis not present

## 2022-10-24 DIAGNOSIS — E119 Type 2 diabetes mellitus without complications: Secondary | ICD-10-CM | POA: Diagnosis not present

## 2022-10-24 DIAGNOSIS — I10 Essential (primary) hypertension: Secondary | ICD-10-CM | POA: Diagnosis not present

## 2022-10-24 DIAGNOSIS — R509 Fever, unspecified: Secondary | ICD-10-CM | POA: Diagnosis not present

## 2022-10-24 DIAGNOSIS — R059 Cough, unspecified: Secondary | ICD-10-CM | POA: Diagnosis not present

## 2022-10-24 DIAGNOSIS — U071 COVID-19: Secondary | ICD-10-CM | POA: Diagnosis not present

## 2022-10-24 DIAGNOSIS — Z7984 Long term (current) use of oral hypoglycemic drugs: Secondary | ICD-10-CM | POA: Diagnosis not present

## 2022-10-24 DIAGNOSIS — R69 Illness, unspecified: Secondary | ICD-10-CM | POA: Diagnosis not present

## 2022-11-12 ENCOUNTER — Ambulatory Visit: Payer: Self-pay | Admitting: Urology

## 2022-11-12 DIAGNOSIS — R3912 Poor urinary stream: Secondary | ICD-10-CM

## 2022-11-15 ENCOUNTER — Other Ambulatory Visit: Payer: Self-pay

## 2022-11-15 ENCOUNTER — Telehealth: Payer: Self-pay

## 2022-11-15 DIAGNOSIS — R3912 Poor urinary stream: Secondary | ICD-10-CM

## 2022-11-15 MED ORDER — TAMSULOSIN HCL 0.4 MG PO CAPS: 0.4000 mg | ORAL_CAPSULE | Freq: Every day | ORAL | 1 refills | Status: AC

## 2022-11-15 NOTE — Telephone Encounter (Signed)
Patient was rescheduled due to office being closed on March 29.  Has been rescheduled to April 23.  He is currently out and needing a refill on tamsulosin (FLOMAX) 0.4 MG CAPS capsule   Pharmacy:  [facility] [address], [place] [address] Phone: [phone]  Fax: [phone]

## 2022-11-15 NOTE — Telephone Encounter (Signed)
Rx sent to cover until scheduled office visit

## 2022-11-15 NOTE — Progress Notes (Signed)
Rx sent to cover until scheduled office visit.

## 2022-11-23 DIAGNOSIS — K5900 Constipation, unspecified: Secondary | ICD-10-CM | POA: Diagnosis not present

## 2022-11-23 DIAGNOSIS — K219 Gastro-esophageal reflux disease without esophagitis: Secondary | ICD-10-CM | POA: Diagnosis not present

## 2022-11-23 DIAGNOSIS — E119 Type 2 diabetes mellitus without complications: Secondary | ICD-10-CM | POA: Diagnosis not present

## 2022-11-23 DIAGNOSIS — E1140 Type 2 diabetes mellitus with diabetic neuropathy, unspecified: Secondary | ICD-10-CM | POA: Diagnosis not present

## 2022-11-23 DIAGNOSIS — F329 Major depressive disorder, single episode, unspecified: Secondary | ICD-10-CM | POA: Diagnosis not present

## 2022-11-23 DIAGNOSIS — Z79899 Other long term (current) drug therapy: Secondary | ICD-10-CM | POA: Diagnosis not present

## 2022-11-23 DIAGNOSIS — Z6834 Body mass index (BMI) 34.0-34.9, adult: Secondary | ICD-10-CM | POA: Diagnosis not present

## 2022-11-24 ENCOUNTER — Telehealth: Payer: Self-pay | Admitting: "Endocrinology

## 2022-11-24 NOTE — Telephone Encounter (Signed)
Pt has made an appointment, does he need to do any labs since its been over a year?  If so can you put those orders in?

## 2022-11-29 ENCOUNTER — Other Ambulatory Visit: Payer: Self-pay | Admitting: "Endocrinology

## 2022-11-29 DIAGNOSIS — E1159 Type 2 diabetes mellitus with other circulatory complications: Secondary | ICD-10-CM

## 2022-11-29 DIAGNOSIS — E782 Mixed hyperlipidemia: Secondary | ICD-10-CM

## 2022-11-29 NOTE — Telephone Encounter (Signed)
Called pt and asked pt to return phone call about lab orders.  Did not leave detailed message due to [facility] had different number listed.

## 2022-11-29 NOTE — Telephone Encounter (Signed)
Pt has made an appointment, does he need to do any labs since its been over a year?  If so can you put those orders in?   

## 2022-12-10 ENCOUNTER — Ambulatory Visit: Payer: Self-pay | Admitting: Urology

## 2022-12-14 ENCOUNTER — Other Ambulatory Visit: Payer: Self-pay

## 2022-12-14 ENCOUNTER — Telehealth: Payer: Self-pay | Admitting: "Endocrinology

## 2022-12-14 ENCOUNTER — Ambulatory Visit: Payer: Self-pay | Admitting: "Endocrinology

## 2022-12-14 MED ORDER — BLOOD GLUCOSE MONITOR KIT: 1.0000 | PACK | Freq: Four times a day (QID) | 0 refills | Status: AC

## 2022-12-14 MED ORDER — ACCU-CHEK GUIDE ME W/DEVICE KIT: 1.0000 | PACK | Freq: Four times a day (QID) | 0 refills | Status: DC

## 2022-12-14 MED ORDER — LANCETS THIN MISC: 1.0000 | Freq: Four times a day (QID) | 1 refills | Status: DC

## 2022-12-14 MED ORDER — ACCU-CHEK GUIDE VI STRP: 1.0000 | ORAL_STRIP | Freq: Four times a day (QID) | 1 refills | Status: DC

## 2022-12-14 NOTE — Telephone Encounter (Signed)
Pt said he does not have a meter and has not been checking his sugar. Can you call in a meter with the accessories for it to [facility] in [place]

## 2022-12-14 NOTE — Telephone Encounter (Signed)
Rx sent to [facility]

## 2023-01-04 ENCOUNTER — Encounter: Payer: Self-pay | Admitting: Urology

## 2023-01-04 ENCOUNTER — Ambulatory Visit (INDEPENDENT_AMBULATORY_CARE_PROVIDER_SITE_OTHER): Payer: 59 | Admitting: Urology

## 2023-01-04 VITALS — BP 129/82 | HR 111

## 2023-01-04 DIAGNOSIS — R3912 Poor urinary stream: Secondary | ICD-10-CM | POA: Diagnosis not present

## 2023-01-04 DIAGNOSIS — N5201 Erectile dysfunction due to arterial insufficiency: Secondary | ICD-10-CM

## 2023-01-04 LAB — URINALYSIS, ROUTINE W REFLEX MICROSCOPIC
Bilirubin, UA: NEGATIVE
Ketones, UA: NEGATIVE
Leukocytes,UA: NEGATIVE
Nitrite, UA: NEGATIVE
Protein,UA: NEGATIVE
RBC, UA: NEGATIVE
Specific Gravity, UA: 1.015 (ref 1.005–1.030)
Urobilinogen, Ur: 0.2 mg/dL (ref 0.2–1.0)
pH, UA: 5.5 (ref 5.0–7.5)

## 2023-01-04 LAB — BLADDER SCAN AMB NON-IMAGING: Scan Result: 31

## 2023-01-04 MED ORDER — ALFUZOSIN HCL ER 10 MG PO TB24: 10.0000 mg | ORAL_TABLET | Freq: Every day | ORAL | 11 refills | Status: AC

## 2023-01-04 MED ORDER — TADALAFIL 20 MG PO TABS: 20.0000 mg | ORAL_TABLET | Freq: Every day | ORAL | 5 refills | Status: AC | PRN

## 2023-01-04 NOTE — Progress Notes (Signed)
01/04/2023 1:14 PM   Don Owens 1976-06-01 [id number]  Referring provider: Waldon Reining, MD [address] [name],  [place] [address]  Followup weak urinary stream and erectile dysfunction   HPI: Don Owens is a 46yo here for followup for weak stream and erectile dysfunction. He is currently on flomax 0.4mg  daily. IPSS 27 QOL 4. Stream is intermittent,ly weak. Nocturia 2-3x. He continues to have poorly controlled DMII. Blood sugars 250-350. He continues to have an inability to get a firm erection. He notes the peyronies plaque is worse. He has pain during intercourse.    PMH: Past Medical History:  Diagnosis Date   Diabetes mellitus    dx age 34   Heart attack (HCC)    Hyperlipidemia    Hypertension    PTSD (post-traumatic stress disorder)    Sleep apnea    Stroke [name])     Surgical History: No past surgical history on file.  Home Medications:  Allergies as of 01/04/2023   No Known Allergies      Medication List        Accurate as of January 04, 2023  1:14 PM. If you have any questions, ask your nurse or doctor.          Accu-Chek Guide Me w/Device Kit 1 each by Does not apply route in the morning, at noon, in the evening, and at bedtime.   Accu-Chek Guide test strip Generic drug: glucose blood 1 each by Other route in the morning, at noon, in the evening, and at bedtime. Use as instructed qid. E11.65   acetaminophen 500 MG tablet Commonly known as: TYLENOL Take 500 mg by mouth every 6 (six) hours as needed.   amitriptyline 50 MG tablet Commonly known as: ELAVIL Take 50 mg by mouth at bedtime.   amoxicillin-clavulanate 875-125 MG tablet Commonly known as: AUGMENTIN Take 1 tablet by mouth every 12 (twelve) hours.   aspirin EC 81 MG tablet Take 1 tablet (81 mg total) by mouth daily.   B-D ULTRAFINE III SHORT PEN 31G X 8 MM Misc Generic drug: Insulin Pen Needle 1 each by Does not apply route as directed.   blood glucose meter kit and  supplies Kit 1 each by Does not apply route 4 (four) times daily. Dispense based on patient and insurance preference. Use up to four times daily as directed. E11.65   buPROPion 200 MG 12 hr tablet Commonly known as: WELLBUTRIN SR Take 200 mg by mouth 2 (two) times daily.   famotidine 20 MG tablet Commonly known as: PEPCID Take 20 mg by mouth 2 (two) times daily.   FreeStyle Libre 2 Reader Don Owens As directed   [employer] 2 Sensor Misc 1 Piece by Does not apply route every 14 (fourteen) days.   gabapentin 300 MG capsule Commonly known as: Neurontin Take 1 capsule (300 mg total) by mouth 2 (two) times daily. What changed: additional instructions   glyBURIDE 5 MG tablet Commonly known as: DIABETA Take 1 tablet (5 mg total) by mouth 2 (two) times daily with a meal.   hydrOXYzine 25 MG tablet Commonly known as: ATARAX Take 1 tablet (25 mg total) by mouth every 6 (six) hours.   ibuprofen 200 MG tablet Commonly known as: ADVIL Take 200 mg by mouth every 6 (six) hours as needed.   Lancets Thin Misc 1 each by Does not apply route in the morning, at noon, in the evening, and at bedtime.   loratadine 10 MG tablet Commonly  known as: CLARITIN Take 20 mg by mouth daily.   metFORMIN 500 MG tablet Commonly known as: GLUCOPHAGE Take 1 tablet (500 mg total) by mouth 2 (two) times daily with a meal.   metoprolol tartrate 100 MG tablet Commonly known as: LOPRESSOR Take 1 tablet (100 mg total) by mouth once for 1 dose.   tamsulosin 0.4 MG Caps capsule Commonly known as: FLOMAX Take 1 capsule (0.4 mg total) by mouth daily after supper.   traZODone 100 MG tablet Commonly known as: DESYREL Take 100 mg by mouth at bedtime.   Don Owens FlexTouch 200 UNIT/ML FlexTouch Pen Generic drug: insulin degludec Inject 20 Units into the skin at bedtime.        Allergies: No Known Allergies  Family History: Family History  Problem Relation Age of Onset   Kidney disease Mother     Heart disease Mother    Diabetes Mother    Stroke Mother    Stroke Father    Hypertension Father     Social History:  reports that he has been smoking cigarettes. He has been smoking an average of 1 pack per day. He has never used smokeless tobacco. He reports that he does not currently use alcohol. He reports that he does not currently use drugs after having used the following drugs: Cocaine and Marijuana.  ROS: All other review of systems were reviewed and are negative except what is noted above in HPI  Physical Exam: BP 129/82   Pulse (!) 111   Constitutional:  Alert and oriented, No acute distress. HEENT: [place] AT, moist mucus membranes.  Trachea midline, no masses. Cardiovascular: No clubbing, cyanosis, or edema. Respiratory: Normal respiratory effort, no increased work of breathing. GI: Abdomen is soft, nontender, nondistended, no abdominal masses GU: No CVA tenderness.  Lymph: No cervical or inguinal lymphadenopathy. Skin: No rashes, bruises or suspicious lesions. Neurologic: Grossly intact, no focal deficits, moving all 4 extremities. Psychiatric: Normal mood and affect.  Laboratory Data: Lab Results  Component Value Date   WBC 12.5 (H) 08/17/2022   HGB 15.0 08/17/2022   HCT 44.8 08/17/2022   MCV 89.1 08/17/2022   PLT 261 08/17/2022    Lab Results  Component Value Date   CREATININE 0.91 08/17/2022    No results found for: "PSA"  No results found for: "TESTOSTERONE"  Lab Results  Component Value Date   HGBA1C 7.2 (H) 01/08/2020    Urinalysis    Component Value Date/Time   COLORURINE YELLOW 08/17/2022 1735   APPEARANCEUR CLEAR 08/17/2022 1735   LABSPEC 1.033 (H) 08/17/2022 1735   PHURINE 6.0 08/17/2022 1735   GLUCOSEU >=500 (A) 08/17/2022 1735   HGBUR NEGATIVE 08/17/2022 1735   BILIRUBINUR NEGATIVE 08/17/2022 1735   KETONESUR NEGATIVE 08/17/2022 1735   PROTEINUR NEGATIVE 08/17/2022 1735   NITRITE NEGATIVE 08/17/2022 1735   LEUKOCYTESUR NEGATIVE  08/17/2022 1735    Lab Results  Component Value Date   BACTERIA NONE SEEN 08/17/2022    Pertinent Imaging:  No results found for this or any previous visit.  No results found for this or any previous visit.  No results found for this or any previous visit.  No results found for this or any previous visit.  No results found for this or any previous visit.  No valid procedures specified. No results found for this or any previous visit.  No results found for this or any previous visit.   Assessment & Plan:    1. Weak urinary stream -We will trial uroxatral   QHS - Urinalysis, Routine w reflex microscopic  2. Erectile dysfunction due to arterial insufficiency -we will trial tadalafil  prn   No follow-ups on file.  Wilkie Aye, MD  [facility] [place]

## 2023-01-04 NOTE — Progress Notes (Signed)
post void residual=31 

## 2023-01-04 NOTE — Patient Instructions (Signed)

## 2023-01-10 ENCOUNTER — Other Ambulatory Visit: Payer: Self-pay | Admitting: "Endocrinology

## 2023-01-18 ENCOUNTER — Other Ambulatory Visit (HOSPITAL_COMMUNITY)
Admission: RE | Admit: 2023-01-18 | Discharge: 2023-01-18 | Disposition: A | Discharge status: Home / Self Care | Payer: 59 | Source: Ambulatory Visit | Attending: "Endocrinology | Admitting: "Endocrinology

## 2023-01-18 DIAGNOSIS — E782 Mixed hyperlipidemia: Secondary | ICD-10-CM | POA: Insufficient documentation

## 2023-01-18 DIAGNOSIS — E1159 Type 2 diabetes mellitus with other circulatory complications: Secondary | ICD-10-CM | POA: Diagnosis not present

## 2023-01-18 LAB — LIPID PANEL
Cholesterol: 107 mg/dL (ref 0–200)
HDL: 41 mg/dL (ref >40)
LDL Cholesterol: 48 mg/dL (ref 0–99)
Total CHOL/HDL Ratio: 2.6 RATIO
Triglycerides: 89 mg/dL (ref <150)
VLDL: 18 mg/dL (ref 0–40)

## 2023-01-18 LAB — T4, FREE: Free T4: 0.97 ng/dL (ref 0.61–1.12)

## 2023-01-18 LAB — TSH: TSH: 1.893 u[IU]/mL (ref 0.350–4.500)

## 2023-01-28 ENCOUNTER — Encounter: Payer: Self-pay | Admitting: "Endocrinology

## 2023-01-28 ENCOUNTER — Ambulatory Visit (INDEPENDENT_AMBULATORY_CARE_PROVIDER_SITE_OTHER): Payer: 59 | Admitting: "Endocrinology

## 2023-01-28 VITALS — BP 126/82 | HR 92 | Ht 71.5 in | Wt 252.8 lb

## 2023-01-28 DIAGNOSIS — Z7984 Long term (current) use of oral hypoglycemic drugs: Secondary | ICD-10-CM

## 2023-01-28 DIAGNOSIS — Z6834 Body mass index (BMI) 34.0-34.9, adult: Secondary | ICD-10-CM

## 2023-01-28 DIAGNOSIS — F17200 Nicotine dependence, unspecified, uncomplicated: Secondary | ICD-10-CM | POA: Diagnosis not present

## 2023-01-28 DIAGNOSIS — E782 Mixed hyperlipidemia: Secondary | ICD-10-CM | POA: Diagnosis not present

## 2023-01-28 DIAGNOSIS — E6609 Other obesity due to excess calories: Secondary | ICD-10-CM

## 2023-01-28 DIAGNOSIS — I10 Essential (primary) hypertension: Secondary | ICD-10-CM

## 2023-01-28 DIAGNOSIS — E1159 Type 2 diabetes mellitus with other circulatory complications: Secondary | ICD-10-CM | POA: Diagnosis not present

## 2023-01-28 LAB — POCT GLYCOSYLATED HEMOGLOBIN (HGB A1C): HbA1c, POC (controlled diabetic range): 10.9 % — AB (ref 0.0–7.0)

## 2023-01-28 MED ORDER — ACCU-CHEK GUIDE VI STRP: ORAL_STRIP | 3 refills | Status: AC

## 2023-01-28 MED ORDER — EMPAGLIFLOZIN 10 MG PO TABS: 10.0000 mg | ORAL_TABLET | Freq: Every day | ORAL | 1 refills | Status: AC

## 2023-01-28 MED ORDER — LANCETS THIN MISC: 1.0000 | Freq: Four times a day (QID) | 1 refills | Status: AC

## 2023-01-28 NOTE — Progress Notes (Signed)
01/28/2023, 1:35 PM  Endocrinology follow-up note   Subjective:    Patient ID: Don Owens, male    DOB: 1976/01/10.  [name] is being seen in follow-up after he was seen in consultation for management of currently uncontrolled symptomatic diabetes requested by  Waldon Reining, MD.   Past Medical History:  Diagnosis Date   Diabetes mellitus    dx age 47   Heart attack (HCC)    Hyperlipidemia    Hypertension    PTSD (post-traumatic stress disorder)    Sleep apnea    Stroke [facility])     History reviewed. No pertinent surgical history.  Social History   Socioeconomic History   Marital status: Married    Spouse name: Not on file   Number of children: Not on file   Years of education: Not on file   Highest education level: Not on file  Occupational History   Not on file  Tobacco Use   Smoking status: Every Day    Packs/day: 1    Types: Cigarettes   Smokeless tobacco: Never  Vaping Use   Vaping Use: Former  Substance and Sexual Activity   Alcohol use: Not Currently    Comment: occ   Drug use: Not Currently    Types: Cocaine, Marijuana    Comment: last used 2020   Sexual activity: Yes    Birth control/protection: None  Other Topics Concern   Not on file  Social History Narrative   ** Merged History Encounter **       Social Determinants of Health   Financial Resource Strain: Not on file  Food Insecurity: Not on file  Transportation Needs: Not on file  Physical Activity: Not on file  Stress: Not on file  Social Connections: Not on file    Family History  Problem Relation Age of Onset   Kidney disease Mother    Heart disease Mother    Diabetes Mother    Stroke Mother    Stroke Father    Hypertension Father     Outpatient Encounter Medications as of 01/28/2023  Medication Sig   empagliflozin (JARDIANCE) 10 MG TABS tablet Take 1 tablet (10 mg total) by mouth daily  before breakfast.   glipiZIDE (GLUCOTROL) 10 MG tablet Take 10 mg by mouth every morning.   metFORMIN (GLUCOPHAGE) 500 MG tablet Take 500 mg by mouth daily with breakfast.   acetaminophen (TYLENOL) 500 MG tablet Take 500 mg by mouth every 6 (six) hours as needed.   alfuzosin (UROXATRAL) 10 MG 24 hr tablet Take 1 tablet (10 mg total) by mouth at bedtime. (Patient not taking: Reported on 01/28/2023)   amitriptyline (ELAVIL) 50 MG tablet Take 50 mg by mouth at bedtime. (Patient not taking: Reported on 01/28/2023)   aspirin EC 81 MG tablet Take 1 tablet (81 mg total) by mouth daily.   blood glucose meter kit and supplies KIT 1 each by Does not apply route 4 (four) times daily. Dispense based on patient and insurance preference. Use up to four times daily as directed. E11.65   Blood Glucose Monitoring Suppl (ACCU-CHEK GUIDE ME) w/Device KIT USE TO CHECK BLOOD SUGAR IN THE  MORNING, AT NOON, IN THE EVENING, AND AT BEDTIME   buPROPion (WELLBUTRIN SR) 200 MG 12 hr tablet Take 200 mg by mouth 2 (two) times daily. (Patient not taking: Reported on 01/28/2023)   famotidine (PEPCID) 20 MG tablet Take 20 mg by mouth 2 (two) times daily.   gabapentin (NEURONTIN) 300 MG capsule Take 1 capsule (300 mg total) by mouth 2 (two) times daily. (Patient taking differently: Take 300 mg by mouth 2 (two) times daily. As needed)   glucose blood (ACCU-CHEK GUIDE) test strip Use to monitor glucose 4 times a day as instructed qid. E11.65   ibuprofen (ADVIL) 200 MG tablet Take 200 mg by mouth every 6 (six) hours as needed.   Insulin Pen Needle (B-D ULTRAFINE III SHORT PEN) 31G X 8 MM MISC 1 each by Does not apply route as directed.   Lancets Thin MISC 1 each by Does not apply route in the morning, at noon, in the evening, and at bedtime.   loratadine (CLARITIN) 10 MG tablet Take 20 mg by mouth daily.   tadalafil (CIALIS) 20 MG tablet Take 1 tablet (20 mg total) by mouth daily as needed.   tamsulosin (FLOMAX) 0.4 MG CAPS capsule Take  1 capsule (0.4 mg total) by mouth daily after supper.   traZODone (DESYREL) 100 MG tablet Take 100 mg by mouth at bedtime.   [DISCONTINUED] amoxicillin-clavulanate (AUGMENTIN) 875-125 MG tablet Take 1 tablet by mouth every 12 (twelve) hours.   [DISCONTINUED] Continuous Blood Gluc Receiver (FREESTYLE LIBRE 2 READER) DEVI As directed   [DISCONTINUED] Continuous Blood Gluc Sensor (FREESTYLE LIBRE 2 SENSOR) MISC 1 Piece by Does not apply route every 14 (fourteen) days.   [DISCONTINUED] glucose blood (ACCU-CHEK GUIDE) test strip 1 each by Other route in the morning, at noon, in the evening, and at bedtime. Use as instructed qid. E11.65   [DISCONTINUED] glyBURIDE (DIABETA) 5 MG tablet Take 1 tablet (5 mg total) by mouth 2 (two) times daily with a meal. (Patient not taking: Reported on 01/28/2023)   [DISCONTINUED] hydrOXYzine (ATARAX) 25 MG tablet Take 1 tablet (25 mg total) by mouth every 6 (six) hours.   [DISCONTINUED] insulin degludec (TRESIBA FLEXTOUCH) 200 UNIT/ML FlexTouch Pen Inject 20 Units into the skin at bedtime.   [DISCONTINUED] Lancets Thin MISC 1 each by Does not apply route in the morning, at noon, in the evening, and at bedtime.   [DISCONTINUED] metFORMIN (GLUCOPHAGE) 500 MG tablet Take 1 tablet (500 mg total) by mouth 2 (two) times daily with a meal.   [DISCONTINUED] metoprolol tartrate (LOPRESSOR) 100 MG tablet Take 1 tablet (100 mg total) by mouth once for 1 dose.   No facility-administered encounter medications on file as of 01/28/2023.    ALLERGIES: No Known Allergies  VACCINATION STATUS:  There is no immunization history on file for this patient.  Diabetes He presents for his follow-up diabetic visit. He has type 2 diabetes mellitus. Onset time: He was diagnosed at approximate age of 37 years. His disease course has been improving (He was seen in this clinic 2 years ago, did not return for follow-up.). There are no hypoglycemic associated symptoms. Pertinent negatives for  hypoglycemia include no confusion, headaches, pallor or seizures. Associated symptoms include blurred vision, polydipsia and polyuria. Pertinent negatives for diabetes include no chest pain, no fatigue, no polyphagia and no weakness. There are no hypoglycemic complications. Symptoms are improving. Diabetic complications include a CVA, heart disease and peripheral neuropathy. (This patient was diagnosed with coronary artery disease at age  29, and CVA at age 32.) Risk factors for coronary artery disease include dyslipidemia, diabetes mellitus, hypertension, male sex, obesity, family history, tobacco exposure, sedentary lifestyle and stress. Current diabetic treatments: He is only on glipizide 10 mg p.o. daily. His weight is fluctuating minimally. He is following a generally unhealthy diet. When asked about meal planning, he reported none. He has not had a previous visit with a dietitian. He never participates in exercise. His home blood glucose trend is fluctuating minimally. His overall blood glucose range is >200 mg/dl. Ohayon was seen in this clinic 2 years ago, was given a plan, did not return for follow-up due to insurance labs.  He returns with A1c of 10.9%, he has very few readings in his meter showing average blood glucose of 348 in the last 90 days out of 8 random readings.  ) An ACE inhibitor/angiotensin II receptor blocker is not being taken.  Hyperlipidemia This is a chronic problem. The current episode started more than 1 year ago. Exacerbating diseases include diabetes and obesity. Pertinent negatives include no chest pain, myalgias or shortness of breath. He is currently on no antihyperlipidemic treatment. Risk factors for coronary artery disease include diabetes mellitus, dyslipidemia, hypertension, male sex, a sedentary lifestyle, family history and obesity.  Hypertension This is a chronic problem. The current episode started more than 1 year ago. Associated symptoms include blurred vision.  Pertinent negatives include no chest pain, headaches, neck pain, palpitations or shortness of breath. Risk factors for coronary artery disease include family history, dyslipidemia, diabetes mellitus, male gender, obesity, sedentary lifestyle and smoking/tobacco exposure. Past treatments include beta blockers. Hypertensive end-organ damage includes CVA.     Objective:       01/28/2023   10:12 AM 01/04/2023   12:57 PM 08/22/2022    6:41 PM  Vitals with BMI  Height 5' 11.5"    Weight 252 lbs 13 oz    BMI 34.77    Systolic 126 129 409  Diastolic 82 82 85  Pulse 92 111 92    BP 126/82   Pulse 92   Ht 5' 11.5" (1.816 m)   Wt 252 lb 12.8 oz (114.7 kg)   BMI 34.77 kg/m   Wt Readings from Last 3 Encounters:  01/28/23 252 lb 12.8 oz (114.7 kg)  08/22/22 236 lb (107 kg)  04/19/22 250 lb (113.4 kg)      CMP ( most recent) CMP     Component Value Date/Time   NA 140 08/17/2022 1800   K 4.1 08/17/2022 1800   CL 106 08/17/2022 1800   CO2 25 08/17/2022 1800   GLUCOSE 344 (H) 08/17/2022 1800   BUN 13 08/17/2022 1800   CREATININE 0.91 08/17/2022 1800   CALCIUM 8.9 08/17/2022 1800   PROT 7.4 08/17/2022 1800   ALBUMIN 3.7 08/17/2022 1800   AST 17 08/17/2022 1800   ALT 25 08/17/2022 1800   ALKPHOS 137 (H) 08/17/2022 1800   BILITOT 0.4 08/17/2022 1800   GFRNONAA >60 08/17/2022 1800   GFRAA >60 01/08/2020 1332     Diabetic Labs (most recent): Lab Results  Component Value Date   HGBA1C 10.9 (A) 01/28/2023   HGBA1C 7.2 (H) 01/08/2020   HGBA1C 8.3 (H) 10/12/2019   MICROALBUR 11.5 (H) 06/14/2019     Lipid Panel ( most recent) Lipid Panel     Component Value Date/Time   CHOL 107 01/18/2023 0830   TRIG 89 01/18/2023 0830   HDL 41 01/18/2023 0830   CHOLHDL 2.6 01/18/2023 0830  VLDL 18 01/18/2023 0830   LDLCALC 48 01/18/2023 0830      Lab Results  Component Value Date   TSH 1.893 01/18/2023   TSH 2.404 06/14/2019   FREET4 0.97 01/18/2023      Assessment & Plan:    1. DM type 2 causing vascular disease (HCC)    - [name] has currently uncontrolled symptomatic type 2 DM since  48 years of age.  Nygil was seen in this clinic 2 years ago, was given a plan, did not return for follow-up due to insurance labs.  He returns with A1c of 10.9%, he has very few readings in his meter showing average blood glucose of 348 in the last 90 days out of 8 random readings.     Recent labs reviewed. - I had a long discussion with him about the progressive nature of diabetes and the pathology behind its complications. -his diabetes is complicated by coronary artery disease, CVA, obesity, sedentary life, smoking, mood disorder including depression and he remains at a high risk for more acute and chronic complications which include CAD, CVA, CKD, retinopathy, and neuropathy. These are all discussed in detail with him.  - I have counseled him on diet  and weight management  by adopting a carbohydrate restricted/protein rich diet. Patient is encouraged to switch to  unprocessed or minimally processed     complex starch and increased protein intake (animal or plant source), fruits, and vegetables. -  he is advised to stick to a routine mealtimes to eat 3 meals  a day and avoid unnecessary snacks ( to snack only to correct hypoglycemia).   - he acknowledges that there is a room for improvement in his food and drink choices. - Suggestion is made for him to avoid simple carbohydrates  from his diet including Cakes, Sweet Desserts, Ice Cream, Soda (diet and regular), Sweet Tea, Candies, Chips, Cookies, Store Bought Juices, Alcohol , Artificial Sweeteners,  Coffee Creamer, and "Sugar-free" Products, Lemonade. This will help patient to have more stable blood glucose profile and potentially avoid unintended weight gain. He will benefit the most from lifestyle medicine, full package will be discussed next visit.  - he will be scheduled with Norm Salt, RDN, CDE for diabetes  education.  - I have approached him with the following individualized plan to manage  his diabetes and patient agrees:   -In light of his presentation with chronic, severe hyperglycemic burden, he will likely need insulin treatment to get him to target.   For this to happen, he has to be willing to monitor blood glucose for safe use of insulin. Accordingly, he is approached to start monitoring blood glucose 4 times a day-before meals and at bedtime and return in 10-15 days for reevaluation.    In the meantime, he is advised to continue glipizide 10 mg p.o. daily at breakfast, discussed and added Jardiance 10 mg p.o. daily at breakfast-side effects and precautions discussed with him specifically genital infections were discussed and he is advised to keep personal hygiene using water 3-4 times a day.    He took himself off of metformin due to the fact that he read about an "article on metformin causing cancer from [facility]" -I had a long discussion with him that metformin is not known to cause cancer and he would benefit from metformin from the point of view of insulin resistance. He hesitantly accepts to restart metformin 500 mg p.o. daily at breakfast.  He has enough supplies of metformin  at home. - he will be considered for GLP-1 receptor agonists if his insurance provides coverage.     - Specific targets for  A1c;  LDL, HDL,  and Triglycerides were discussed with the patient.  2) Blood Pressure /Hypertension:   His blood pressure is controlled to target.  He is on metoprolol 100 mg p.o. daily at breakfast, advised to continue.  3) Lipids/Hyperlipidemia: His recent lipid panel showed controlled LDL at 48.  He is not on statin medications.     4) obesity-class I :  Body mass index is 34.77 kg/m.  -   clearly complicating his diabetes care.   he is  a candidate for weight loss. I discussed with him the fact that loss of 5 - 10% of his  current body weight will have the most impact on his  diabetes management.   [employer] , [employer]- Lifestyle Nutrition is discussed and recommended to the patient. Optimal Exercise, Restorative Sleep  information was detailed on discharge instructions.  5) Chronic Care/Health Maintenance:  -he  is not  on ACEI/ARB and Statin medications and  is encouraged to initiate and continue to follow up with Ophthalmology, Dentist,  Podiatrist at least yearly or according to recommendations, and advised to  quit smoking. I have recommended yearly flu vaccine and pneumonia vaccine at least every 5 years; moderate intensity exercise for up to 150 minutes weekly; and  sleep for 7- 9 hours a day.  Patient is accompanied by his significant other as well as grandchild to the clinic.  The patient was counseled on the dangers of tobacco use, and was advised to quit.  Reviewed strategies to maximize success, including removing cigarettes and smoking materials from environment.   - he is  advised to maintain close follow up with Waldon Reining, MD for primary care needs, as well as his other providers for optimal and coordinated care.   I spent  26  minutes in the care of the patient today including review of labs from CMP, Lipids, Thyroid Function, Hematology (current and previous including abstractions from other facilities); face-to-face time discussing  his blood glucose readings/logs, discussing hypoglycemia and hyperglycemia episodes and symptoms, medications doses, his options of short and long term treatment based on the latest standards of care / guidelines;  discussion about incorporating lifestyle medicine;  and documenting the encounter. Risk reduction counseling performed per USPSTF guidelines to reduce  obesity and cardiovascular risk factors.     Please refer to Patient Instructions for Blood Glucose Monitoring and Insulin/Medications Dosing Guide"  in media tab for additional information. Please  also refer to " Patient Self Inventory" in the  Media  tab for reviewed elements of pertinent patient history.  [name] participated in the discussions, expressed understanding, and voiced agreement with the above plans.  All questions were answered to his satisfaction. he is encouraged to contact clinic should he have any questions or concerns prior to his return visit.   Follow up plan: - Return in about 2 weeks (around 02/11/2023) for F/U with Meter/CGM Megan Salon Only - no Labs.  Marquis Lunch, MD [facility] [address] [place], [place] [address] Phone: [phone]  Fax: [phone]    01/28/2023, 1:35 PM  This note was partially dictated with voice recognition software. Similar sounding words can be transcribed inadequately or may not  be corrected upon review.

## 2023-01-28 NOTE — Patient Instructions (Signed)

## 2023-02-25 ENCOUNTER — Ambulatory Visit: Payer: 59 | Admitting: "Endocrinology

## 2023-03-07 ENCOUNTER — Ambulatory Visit: Payer: 59 | Admitting: Urology

## 2023-03-07 DIAGNOSIS — R3912 Poor urinary stream: Secondary | ICD-10-CM

## 2023-04-03 DIAGNOSIS — E1140 Type 2 diabetes mellitus with diabetic neuropathy, unspecified: Secondary | ICD-10-CM | POA: Diagnosis not present

## 2023-04-03 DIAGNOSIS — G4700 Insomnia, unspecified: Secondary | ICD-10-CM | POA: Diagnosis not present

## 2023-04-03 DIAGNOSIS — W19XXXA Unspecified fall, initial encounter: Secondary | ICD-10-CM | POA: Diagnosis not present

## 2023-04-03 DIAGNOSIS — Z79891 Long term (current) use of opiate analgesic: Secondary | ICD-10-CM | POA: Diagnosis not present

## 2023-04-03 DIAGNOSIS — S29011A Strain of muscle and tendon of front wall of thorax, initial encounter: Secondary | ICD-10-CM | POA: Diagnosis not present

## 2023-04-03 DIAGNOSIS — Z79899 Other long term (current) drug therapy: Secondary | ICD-10-CM | POA: Diagnosis not present

## 2023-04-03 DIAGNOSIS — R0789 Other chest pain: Secondary | ICD-10-CM | POA: Diagnosis not present

## 2023-04-03 DIAGNOSIS — F17210 Nicotine dependence, cigarettes, uncomplicated: Secondary | ICD-10-CM | POA: Diagnosis not present

## 2023-04-03 DIAGNOSIS — F32A Depression, unspecified: Secondary | ICD-10-CM | POA: Diagnosis not present

## 2023-04-03 DIAGNOSIS — I10 Essential (primary) hypertension: Secondary | ICD-10-CM | POA: Diagnosis not present

## 2023-04-03 DIAGNOSIS — M25512 Pain in left shoulder: Secondary | ICD-10-CM | POA: Diagnosis not present

## 2023-04-29 ENCOUNTER — Emergency Department (HOSPITAL_COMMUNITY)
Admission: EM | Admit: 2023-04-29 | Discharge: 2023-04-29 | Disposition: A | Discharge status: Home / Self Care | Payer: 59 | Attending: Emergency Medicine | Admitting: Emergency Medicine

## 2023-04-29 ENCOUNTER — Other Ambulatory Visit: Payer: Self-pay

## 2023-04-29 ENCOUNTER — Encounter (HOSPITAL_COMMUNITY): Payer: Self-pay | Admitting: Emergency Medicine

## 2023-04-29 ENCOUNTER — Emergency Department (HOSPITAL_COMMUNITY): Payer: 59

## 2023-04-29 DIAGNOSIS — I10 Essential (primary) hypertension: Secondary | ICD-10-CM | POA: Insufficient documentation

## 2023-04-29 DIAGNOSIS — Z7984 Long term (current) use of oral hypoglycemic drugs: Secondary | ICD-10-CM | POA: Insufficient documentation

## 2023-04-29 DIAGNOSIS — E1165 Type 2 diabetes mellitus with hyperglycemia: Secondary | ICD-10-CM | POA: Diagnosis not present

## 2023-04-29 DIAGNOSIS — Z794 Long term (current) use of insulin: Secondary | ICD-10-CM | POA: Insufficient documentation

## 2023-04-29 DIAGNOSIS — Z79899 Other long term (current) drug therapy: Secondary | ICD-10-CM | POA: Insufficient documentation

## 2023-04-29 DIAGNOSIS — M722 Plantar fascial fibromatosis: Secondary | ICD-10-CM

## 2023-04-29 DIAGNOSIS — M79671 Pain in right foot: Secondary | ICD-10-CM | POA: Diagnosis not present

## 2023-04-29 DIAGNOSIS — Z7982 Long term (current) use of aspirin: Secondary | ICD-10-CM | POA: Insufficient documentation

## 2023-04-29 DIAGNOSIS — M7731 Calcaneal spur, right foot: Secondary | ICD-10-CM | POA: Diagnosis not present

## 2023-04-29 DIAGNOSIS — R739 Hyperglycemia, unspecified: Secondary | ICD-10-CM

## 2023-04-29 DIAGNOSIS — Z8673 Personal history of transient ischemic attack (TIA), and cerebral infarction without residual deficits: Secondary | ICD-10-CM | POA: Diagnosis not present

## 2023-04-29 LAB — BLOOD GAS, VENOUS
Acid-Base Excess: 4.5 mmol/L — ABNORMAL HIGH (ref 0.0–2.0)
Bicarbonate: 29.8 mmol/L — ABNORMAL HIGH (ref 20.0–28.0)
Drawn by: 4237
O2 Saturation: 69.1 %
Patient temperature: 36.8
pCO2, Ven: 46 mmHg (ref 44–60)
pH, Ven: 7.42 (ref 7.25–7.43)
pO2, Ven: 35 mmHg (ref 32–45)

## 2023-04-29 LAB — CBC
HCT: 40.7 % (ref 39.0–52.0)
Hemoglobin: 14.1 g/dL (ref 13.0–17.0)
MCH: 30.9 pg (ref 26.0–34.0)
MCHC: 34.6 g/dL (ref 30.0–36.0)
MCV: 89.1 fL (ref 80.0–100.0)
Platelets: 205 10*3/uL (ref 150–400)
RBC: 4.57 MIL/uL (ref 4.22–5.81)
RDW: 13.1 % (ref 11.5–15.5)
WBC: 8.8 10*3/uL (ref 4.0–10.5)
nRBC: 0 % (ref 0.0–0.2)

## 2023-04-29 LAB — BASIC METABOLIC PANEL
Anion gap: 16 — ABNORMAL HIGH (ref 5–15)
BUN: 16 mg/dL (ref 6–20)
CO2: 22 mmol/L (ref 22–32)
Calcium: 8.8 mg/dL — ABNORMAL LOW (ref 8.9–10.3)
Chloride: 94 mmol/L — ABNORMAL LOW (ref 98–111)
Creatinine, Ser: 1.01 mg/dL (ref 0.61–1.24)
GFR, Estimated: >60 mL/min (ref >60)
Glucose, Bld: 525 mg/dL (ref 70–99)
Potassium: 4.4 mmol/L (ref 3.5–5.1)
Sodium: 132 mmol/L — ABNORMAL LOW (ref 135–145)

## 2023-04-29 LAB — CBG MONITORING, ED
Glucose-Capillary: 539 mg/dL (ref 70–99)
Glucose-Capillary: 370 mg/dL — ABNORMAL HIGH (ref 70–99)
Glucose-Capillary: 292 mg/dL — ABNORMAL HIGH (ref 70–99)

## 2023-04-29 MED ORDER — INSULIN ASPART 100 UNIT/ML IJ SOLN
8.0000 [IU] | Freq: Once | INTRAMUSCULAR | Status: AC
  Administered 2023-04-29: 8 [IU] via INTRAVENOUS
  Filled 2023-04-29: qty 1

## 2023-04-29 MED ORDER — OXYCODONE-ACETAMINOPHEN 5-325 MG PO TABS
1.0000 | ORAL_TABLET | Freq: Once | ORAL | Status: AC
  Administered 2023-04-29: 1 via ORAL
  Filled 2023-04-29: qty 1

## 2023-04-29 MED ORDER — SODIUM CHLORIDE 0.9 % IV BOLUS
1000.0000 mL | Freq: Once | INTRAVENOUS | Status: AC
  Administered 2023-04-29: 1000 mL via INTRAVENOUS

## 2023-04-29 MED ORDER — INSULIN ASPART 100 UNIT/ML IJ SOLN
6.0000 [IU] | Freq: Once | INTRAMUSCULAR | Status: AC
  Administered 2023-04-29: 6 [IU] via INTRAVENOUS
  Filled 2023-04-29: qty 1

## 2023-04-29 NOTE — ED Notes (Signed)
Date and time results received: 04/29/23 15:55 (use smartphrase ".now" to insert current time)  Test: CBG  Critical Value: 539  Name of Provider Notified: Luther Hearing   Orders Received? Or Actions Taken?: Orders Received - See Orders for details

## 2023-04-29 NOTE — ED Triage Notes (Signed)
Pt c/o heel of foot has been hurting x 1 week now radiating into his arch. Denies injury. Nad. No obvious deformity/swelling noted. Pedal pulse present

## 2023-04-29 NOTE — ED Notes (Addendum)
Date and time results received: 04/29/23 18:38 (use smartphrase ".now" to insert current time)  Test: CBG Critical Value: 360  Name of Provider Notified: Christian P.  Orders Received? Or Actions Taken?: Orders Received - See Orders for details

## 2023-04-29 NOTE — ED Provider Notes (Signed)
[place] [facility] AT [facility] Provider Note   CSN: [id number] Arrival date & time: 04/29/23  1527     History  Chief Complaint  Patient presents with   Foot Pain    Don Owens is a 47 y.o. male with past medical history significant for PTSD, hypertension, previous stroke, diabetes, hyperlipidemia, ACS who presents with concern for heel of right foot hurting for a week, radiating to arch.  Denies any known injury.  She reports worse when taking steps, reports the pain is sharp in nature.  Additionally with CBG of greater than 500 on arrival.  Patient reports that he is supposed to be taking metformin but reports that he stopped taking it because he read a study that shows that it causes cancer.  Patient reports that he additionally was post to be taking insulin but that it was not covered by his insurance and so he stopped taking it.   Foot Pain       Home Medications Prior to Admission medications   Medication Sig Start Date End Date Taking? Authorizing Provider  acetaminophen (TYLENOL) 500 MG tablet Take 500 mg by mouth every 6 (six) hours as needed.    [provider]  alfuzosin (UROXATRAL) 10 MG 24 hr tablet Take 1 tablet (10 mg total) by mouth at bedtime. Patient not taking: Reported on 01/28/2023 01/04/23   Malen Gauze, MD  amitriptyline (ELAVIL) 50 MG tablet Take 50 mg by mouth at bedtime. Patient not taking: Reported on 01/28/2023 05/13/21   [provider]  aspirin EC 81 MG tablet Take 1 tablet (81 mg total) by mouth daily. 06/13/19   Jacquelin Hawking, PA-C  blood glucose meter kit and supplies KIT 1 each by Does not apply route 4 (four) times daily. Dispense based on patient and insurance preference. Use up to four times daily as directed. E11.65 12/14/22   Roma Kayser, MD  Blood Glucose Monitoring Suppl (ACCU-CHEK GUIDE ME) w/Device KIT USE TO CHECK BLOOD SUGAR IN THE MORNING, AT NOON, IN THE EVENING, AND AT  BEDTIME 01/11/23   Roma Kayser, MD  buPROPion (WELLBUTRIN SR) 200 MG 12 hr tablet Take 200 mg by mouth 2 (two) times daily. Patient not taking: Reported on 01/28/2023 05/13/21   [provider]  empagliflozin (JARDIANCE) 10 MG TABS tablet Take 1 tablet (10 mg total) by mouth daily before breakfast. 01/28/23   Nida, Denman George, MD  famotidine (PEPCID) 20 MG tablet Take 20 mg by mouth 2 (two) times daily. 05/13/21   [provider]  gabapentin (NEURONTIN) 300 MG capsule Take 1 capsule (300 mg total) by mouth 2 (two) times daily. Patient taking differently: Take 300 mg by mouth 2 (two) times daily. As needed 11/12/20   Triplett, Tammy, PA-C  glipiZIDE (GLUCOTROL) 10 MG tablet Take 10 mg by mouth every morning. 01/09/23   [provider]  glucose blood (ACCU-CHEK GUIDE) test strip Use to monitor glucose 4 times a day as instructed qid. E11.65 01/28/23   Roma Kayser, MD  ibuprofen (ADVIL) 200 MG tablet Take 200 mg by mouth every 6 (six) hours as needed.    [provider]  Insulin Pen Needle (B-D ULTRAFINE III SHORT PEN) 31G X 8 MM MISC 1 each by Does not apply route as directed. 08/24/21   Roma Kayser, MD  Lancets Thin MISC 1 each by Does not apply route in the morning, at noon, in the evening, and at bedtime. 01/28/23  Roma Kayser, MD  loratadine (CLARITIN) 10 MG tablet Take 20 mg by mouth daily.    [provider]  metFORMIN (GLUCOPHAGE) 500 MG tablet Take 500 mg by mouth daily with breakfast.    [provider]  tadalafil (CIALIS) 20 MG tablet Take 1 tablet (20 mg total) by mouth daily as needed. 01/04/23   McKenzie, Mardene Celeste, MD  tamsulosin (FLOMAX) 0.4 MG CAPS capsule Take 1 capsule (0.4 mg total) by mouth daily after supper. 11/15/22   McKenzie, Mardene Celeste, MD  traZODone (DESYREL) 100 MG tablet Take 100 mg by mouth at bedtime.    [provider]      Allergies    Patient has no known allergies.     Review of Systems   Review of Systems  All other systems reviewed and are negative.   Physical Exam Updated Vital Signs BP (!) 142/86 (BP Location: Right Arm)   Pulse 87   Temp 97.9 F (36.6 C) (Oral)   Resp 17   SpO2 96%  Physical Exam Vitals and nursing note reviewed.  Constitutional:      General: He is not in acute distress.    Appearance: Normal appearance.  HENT:     Head: Normocephalic and atraumatic.  Eyes:     General:        Right eye: No discharge.        Left eye: No discharge.  Cardiovascular:     Rate and Rhythm: Normal rate and regular rhythm.  Pulmonary:     Effort: Pulmonary effort is normal. No respiratory distress.  Musculoskeletal:        General: No deformity.     Comments: Patient with tenderness of the right foot at the heel, he has some tightness in the Achilles tendon without rupture noted.  Tenderness in plantar fascia distribution.  Skin:    General: Skin is warm and dry.  Neurological:     Mental Status: He is alert and oriented to person, place, and time.  Psychiatric:        Mood and Affect: Mood normal.        Behavior: Behavior normal.     ED Results / Procedures / Treatments   Labs (all labs ordered are listed, but only abnormal results are displayed) Labs Reviewed  BLOOD GAS, VENOUS - Abnormal; Notable for the following components:      Result Value   Bicarbonate 29.8 (*)    Acid-Base Excess 4.5 (*)    All other components within normal limits  BASIC METABOLIC PANEL - Abnormal; Notable for the following components:   Sodium 132 (*)    Chloride 94 (*)    Glucose, Bld 525 (*)    Calcium 8.8 (*)    Anion gap 16 (*)    All other components within normal limits  CBG MONITORING, ED - Abnormal; Notable for the following components:   Glucose-Capillary 539 (*)    All other components within normal limits  CBG MONITORING, ED - Abnormal; Notable for the following components:   Glucose-Capillary 370 (*)    All other components  within normal limits  CBC    EKG None  Radiology DG Os Calcis Right  Result Date: 04/29/2023 CLINICAL DATA:  Right heel pain for 1 week EXAM: RIGHT OS CALCIS - 2+ VIEW COMPARISON:  None Available. FINDINGS: No acute fracture or dislocation. No aggressive osseous lesion. Normal alignment. Small plantar calcaneal spur. Enthesopathic changes of the Achilles tendon insertion. Soft tissue are  unremarkable. No radiopaque foreign body or soft tissue emphysema. IMPRESSION: 1. No acute osseous injury of the right calcaneus. Electronically Signed   By: Elige Ko M.D.   On: 04/29/2023 16:28    Procedures Procedures    Medications Ordered in ED Medications  insulin aspart (novoLOG) injection 6 Units (has no administration in time range)  sodium chloride 0.9 % bolus 1,000 mL (1,000 mLs Intravenous New Bag/Given 04/29/23 1723)  insulin aspart (novoLOG) injection 8 Units (8 Units Intravenous Given 04/29/23 1729)  oxyCODONE-acetaminophen (PERCOCET/ROXICET) 5-325 MG per tablet 1 tablet (1 tablet Oral Given 04/29/23 1722)    ED Course/ Medical Decision Making/ A&P                                 Medical Decision Making Amount and/or Complexity of Data Reviewed Labs: ordered. Radiology: ordered.  Risk Prescription drug management.   This patient is a 47 y.o. male  who presents to the ED for concern of right foot pain, elevated blood sugar noted in ED.   Differential diagnoses prior to evaluation: The emergent differential diagnosis includes, but is not limited to, DKA, HHS, versus hyperglycemia without acidosis.  For right foot pain considered acute fracture, sprain, strain, plantar fasciitis, Achilles tendinitis, versus other. This is not an exhaustive differential.   Past Medical History / Co-morbidities / Social History: PTSD, hypertension, previous stroke, diabetes, hyperlipidemia, ACS  Additional history: Chart reviewed. Pertinent results include: Reviewed lab work, imaging from  previous emergency department visits  Physical Exam: Physical exam performed. The pertinent findings include: Patient with tenderness of the right foot at the heel, tightness of Achilles tendon, tenderness in plantar fascial distribution.  Lab Tests/Imaging studies: I personally interpreted labs/imaging and the pertinent results include: CBC unremarkable, VBG without acidosis.  BMP notable for pseudohyponatremia, sodium 132 context of glucose 525.  He does have a mild anion gap at 16 but no acidosis.  CBG improved to 370 after initial insulin, we will administer another 6 units along with fluid bolus and reassess.  I independently interpreted right foot x-ray which shows no evidence of acute fracture, dislocation.  I agree with the radiologist interpretation.   Medications: I ordered medication including insulin, fluids for hyperglycemia.  I have reviewed the patients home medicines and have made adjustments as needed.   7:05 PM Care of Don Owens transferred to PA Lorin Roemhildt and Dr. Estell Harpin at the end of my shift as the patient will require reassessment once labs/imaging have resulted. Patient presentation, ED course, and plan of care discussed with review of all pertinent labs and imaging. Please see his/her note for further details regarding further ED course and disposition. Plan at time of handoff is follow up CBG, . This may be altered or completely changed at the discretion of the oncoming team pending results of further workup.   Final Clinical Impression(s) / ED Diagnoses Final diagnoses:  Hyperglycemia  Plantar fasciitis of right foot    Rx / DC Orders ED Discharge Orders     None         [place] 04/29/23 Kerrin Champagne, MD 04/30/23 1157

## 2023-04-29 NOTE — Discharge Instructions (Addendum)
I independently reviewed the literature regarding metformin, there is no current peer-reviewed research to suggest that metformin has a higher incidence of cancer.  I recommend that you begin taking your metformin again if you cannot currently afford your insulin because it is critical to keep your blood sugar in a normal range.  High blood sugar puts you at high risk for vision loss, heart disease, peripheral vascular disease, and early death, it can lead to lower extremity amputations from non healing foot wounds.   Your blood sugar was originally 525 when you arrived and after insulin it is under 300.   Please use Tylenol for pain.  You may use 1000 mg of Tylenol every 6 hours.  Not to exceed 4 g of Tylenol within 24 hours.  I have attached some rehab exercises for plantar fasciitis, if you have not had significant relief with the exercises please follow-up with the orthopedic doctor whose contact formation I provided above.

## 2023-07-02 DIAGNOSIS — E785 Hyperlipidemia, unspecified: Secondary | ICD-10-CM | POA: Diagnosis not present

## 2023-07-02 DIAGNOSIS — F325 Major depressive disorder, single episode, in full remission: Secondary | ICD-10-CM | POA: Diagnosis not present

## 2023-07-02 DIAGNOSIS — I10 Essential (primary) hypertension: Secondary | ICD-10-CM | POA: Diagnosis not present

## 2023-07-02 DIAGNOSIS — Z8673 Personal history of transient ischemic attack (TIA), and cerebral infarction without residual deficits: Secondary | ICD-10-CM | POA: Diagnosis not present

## 2023-07-02 DIAGNOSIS — Z6832 Body mass index (BMI) 32.0-32.9, adult: Secondary | ICD-10-CM | POA: Diagnosis not present

## 2023-07-02 DIAGNOSIS — Z8249 Family history of ischemic heart disease and other diseases of the circulatory system: Secondary | ICD-10-CM | POA: Diagnosis not present

## 2023-07-02 DIAGNOSIS — K219 Gastro-esophageal reflux disease without esophagitis: Secondary | ICD-10-CM | POA: Diagnosis not present

## 2023-07-02 DIAGNOSIS — N400 Enlarged prostate without lower urinary tract symptoms: Secondary | ICD-10-CM | POA: Diagnosis not present

## 2023-07-02 DIAGNOSIS — Z833 Family history of diabetes mellitus: Secondary | ICD-10-CM | POA: Diagnosis not present

## 2023-07-02 DIAGNOSIS — Z7984 Long term (current) use of oral hypoglycemic drugs: Secondary | ICD-10-CM | POA: Diagnosis not present

## 2023-07-02 DIAGNOSIS — E1140 Type 2 diabetes mellitus with diabetic neuropathy, unspecified: Secondary | ICD-10-CM | POA: Diagnosis not present

## 2023-07-02 DIAGNOSIS — F17210 Nicotine dependence, cigarettes, uncomplicated: Secondary | ICD-10-CM | POA: Diagnosis not present

## 2023-09-04 DIAGNOSIS — I10 Essential (primary) hypertension: Secondary | ICD-10-CM | POA: Diagnosis not present

## 2023-09-04 DIAGNOSIS — Z1152 Encounter for screening for COVID-19: Secondary | ICD-10-CM | POA: Diagnosis not present

## 2023-09-04 DIAGNOSIS — Z7984 Long term (current) use of oral hypoglycemic drugs: Secondary | ICD-10-CM | POA: Diagnosis not present

## 2023-09-04 DIAGNOSIS — R0981 Nasal congestion: Secondary | ICD-10-CM | POA: Diagnosis not present

## 2023-09-04 DIAGNOSIS — J069 Acute upper respiratory infection, unspecified: Secondary | ICD-10-CM | POA: Diagnosis not present

## 2023-09-04 DIAGNOSIS — E1140 Type 2 diabetes mellitus with diabetic neuropathy, unspecified: Secondary | ICD-10-CM | POA: Diagnosis not present

## 2023-09-04 DIAGNOSIS — J029 Acute pharyngitis, unspecified: Secondary | ICD-10-CM | POA: Diagnosis not present

## 2023-09-04 DIAGNOSIS — Z20822 Contact with and (suspected) exposure to covid-19: Secondary | ICD-10-CM | POA: Diagnosis not present

## 2023-09-04 DIAGNOSIS — E119 Type 2 diabetes mellitus without complications: Secondary | ICD-10-CM | POA: Diagnosis not present

## 2023-09-04 DIAGNOSIS — F32A Depression, unspecified: Secondary | ICD-10-CM | POA: Diagnosis not present

## 2023-09-04 DIAGNOSIS — G4700 Insomnia, unspecified: Secondary | ICD-10-CM | POA: Diagnosis not present

## 2023-09-04 DIAGNOSIS — R059 Cough, unspecified: Secondary | ICD-10-CM | POA: Diagnosis not present

## 2023-09-04 DIAGNOSIS — F17210 Nicotine dependence, cigarettes, uncomplicated: Secondary | ICD-10-CM | POA: Diagnosis not present

## 2023-10-25 DIAGNOSIS — E119 Type 2 diabetes mellitus without complications: Secondary | ICD-10-CM | POA: Diagnosis not present

## 2024-01-03 DIAGNOSIS — E785 Hyperlipidemia, unspecified: Secondary | ICD-10-CM | POA: Diagnosis not present

## 2024-01-03 DIAGNOSIS — E119 Type 2 diabetes mellitus without complications: Secondary | ICD-10-CM | POA: Diagnosis not present

## 2024-01-03 DIAGNOSIS — F329 Major depressive disorder, single episode, unspecified: Secondary | ICD-10-CM | POA: Diagnosis not present

## 2024-01-03 DIAGNOSIS — Z91199 Patient's noncompliance with other medical treatment and regimen due to unspecified reason: Secondary | ICD-10-CM | POA: Diagnosis not present

## 2024-01-03 DIAGNOSIS — F17200 Nicotine dependence, unspecified, uncomplicated: Secondary | ICD-10-CM | POA: Diagnosis not present

## 2024-01-03 DIAGNOSIS — Z713 Dietary counseling and surveillance: Secondary | ICD-10-CM | POA: Diagnosis not present

## 2024-01-03 DIAGNOSIS — I2510 Atherosclerotic heart disease of native coronary artery without angina pectoris: Secondary | ICD-10-CM | POA: Diagnosis not present

## 2024-01-03 DIAGNOSIS — Z7182 Exercise counseling: Secondary | ICD-10-CM | POA: Diagnosis not present

## 2024-01-03 DIAGNOSIS — E669 Obesity, unspecified: Secondary | ICD-10-CM | POA: Diagnosis not present

## 2024-04-12 DIAGNOSIS — M70811 Other soft tissue disorders related to use, overuse and pressure, right shoulder: Secondary | ICD-10-CM | POA: Diagnosis not present

## 2024-05-16 DIAGNOSIS — J069 Acute upper respiratory infection, unspecified: Secondary | ICD-10-CM | POA: Diagnosis not present

## 2024-05-16 DIAGNOSIS — G4700 Insomnia, unspecified: Secondary | ICD-10-CM | POA: Diagnosis not present

## 2024-05-16 DIAGNOSIS — R059 Cough, unspecified: Secondary | ICD-10-CM | POA: Diagnosis not present

## 2024-08-02 DIAGNOSIS — E1140 Type 2 diabetes mellitus with diabetic neuropathy, unspecified: Secondary | ICD-10-CM | POA: Diagnosis not present

## 2024-08-02 DIAGNOSIS — G4700 Insomnia, unspecified: Secondary | ICD-10-CM | POA: Diagnosis not present

## 2024-08-02 DIAGNOSIS — E785 Hyperlipidemia, unspecified: Secondary | ICD-10-CM | POA: Diagnosis not present

## 2024-08-02 DIAGNOSIS — F329 Major depressive disorder, single episode, unspecified: Secondary | ICD-10-CM | POA: Diagnosis not present

## 2024-08-02 DIAGNOSIS — E119 Type 2 diabetes mellitus without complications: Secondary | ICD-10-CM | POA: Diagnosis not present

## 2024-08-02 DIAGNOSIS — Z79899 Other long term (current) drug therapy: Secondary | ICD-10-CM | POA: Diagnosis not present

## 2024-08-06 DIAGNOSIS — Z79899 Other long term (current) drug therapy: Secondary | ICD-10-CM | POA: Diagnosis not present

## 2024-08-06 DIAGNOSIS — E119 Type 2 diabetes mellitus without complications: Secondary | ICD-10-CM | POA: Diagnosis not present

## 2024-08-06 DIAGNOSIS — E785 Hyperlipidemia, unspecified: Secondary | ICD-10-CM | POA: Diagnosis not present

## 2024-08-16 ENCOUNTER — Telehealth: Payer: Self-pay | Admitting: "Endocrinology

## 2024-08-16 DIAGNOSIS — Z79899 Other long term (current) drug therapy: Secondary | ICD-10-CM | POA: Diagnosis not present

## 2024-08-16 DIAGNOSIS — E119 Type 2 diabetes mellitus without complications: Secondary | ICD-10-CM | POA: Diagnosis not present

## 2024-08-16 DIAGNOSIS — F329 Major depressive disorder, single episode, unspecified: Secondary | ICD-10-CM | POA: Diagnosis not present

## 2024-08-16 DIAGNOSIS — E1140 Type 2 diabetes mellitus with diabetic neuropathy, unspecified: Secondary | ICD-10-CM | POA: Diagnosis not present

## 2024-08-16 DIAGNOSIS — E785 Hyperlipidemia, unspecified: Secondary | ICD-10-CM | POA: Diagnosis not present

## 2024-08-16 DIAGNOSIS — G4700 Insomnia, unspecified: Secondary | ICD-10-CM | POA: Diagnosis not present

## 2024-08-16 NOTE — Telephone Encounter (Signed)
 Received notes and labs from PCP stating they want him to be seen. Called pt no answer VM full. He does need an appt but not a new pt slot. He was last seen 2024
